# Patient Record
Sex: Male | Born: 1960
Health system: Southern US, Community
[De-identification: ages and names within clinical notes are randomized; demographics above are authoritative.]

## PROBLEM LIST (undated history)

## (undated) DIAGNOSIS — E041 Nontoxic single thyroid nodule: Secondary | ICD-10-CM

## (undated) DIAGNOSIS — E785 Hyperlipidemia, unspecified: Secondary | ICD-10-CM

## (undated) DIAGNOSIS — N200 Calculus of kidney: Secondary | ICD-10-CM

## (undated) DIAGNOSIS — I639 Cerebral infarction, unspecified: Secondary | ICD-10-CM

## (undated) HISTORY — DX: Hyperlipidemia, unspecified: E78.5

## (undated) HISTORY — DX: Calculus of kidney: N20.0

## (undated) HISTORY — DX: Nontoxic single thyroid nodule: E04.1

## (undated) HISTORY — DX: Cerebral infarction, unspecified: I63.9

---

## 2001-01-06 HISTORY — PX: APPENDECTOMY: SHX54

## 2004-08-10 ENCOUNTER — Emergency Department: Payer: Self-pay | Admitting: Emergency Medicine

## 2004-08-15 ENCOUNTER — Emergency Department: Payer: Self-pay | Admitting: Unknown Physician Specialty

## 2005-03-30 ENCOUNTER — Emergency Department: Payer: Self-pay | Admitting: Emergency Medicine

## 2005-04-24 ENCOUNTER — Ambulatory Visit: Payer: Self-pay | Admitting: Urology

## 2008-04-28 ENCOUNTER — Ambulatory Visit: Payer: Self-pay | Admitting: Family Medicine

## 2011-01-07 DIAGNOSIS — E041 Nontoxic single thyroid nodule: Secondary | ICD-10-CM

## 2011-01-07 DIAGNOSIS — I639 Cerebral infarction, unspecified: Secondary | ICD-10-CM

## 2011-01-07 HISTORY — DX: Nontoxic single thyroid nodule: E04.1

## 2011-01-07 HISTORY — DX: Cerebral infarction, unspecified: I63.9

## 2011-10-02 LAB — BASIC METABOLIC PANEL
Calcium, Total: 9.1 mg/dL (ref 8.5–10.1)
Chloride: 106 mmol/L (ref 98–107)
Co2: 26 mmol/L (ref 21–32)
Creatinine: 1.02 mg/dL (ref 0.60–1.30)
EGFR (African American): >60
Glucose: 177 mg/dL — ABNORMAL HIGH (ref 65–99)
Potassium: 3.8 mmol/L (ref 3.5–5.1)
Sodium: 141 mmol/L (ref 136–145)

## 2011-10-02 LAB — CBC
Platelet: 211 10*3/uL (ref 150–440)
RDW: 13 % (ref 11.5–14.5)
WBC: 9.5 10*3/uL (ref 3.8–10.6)

## 2011-10-02 LAB — TROPONIN I: Troponin-I: <0.02 ng/mL

## 2011-10-03 ENCOUNTER — Inpatient Hospital Stay: Payer: Self-pay | Admitting: Student

## 2011-10-03 LAB — URINALYSIS, COMPLETE
Bacteria: NONE SEEN
Blood: NEGATIVE
Glucose,UR: 50 mg/dL (ref 0–75)
Ketone: NEGATIVE
Specific Gravity: 1.016 (ref 1.003–1.030)
Squamous Epithelial: NONE SEEN
WBC UR: NONE SEEN /HPF (ref 0–5)

## 2011-10-04 LAB — LIPID PANEL
Ldl Cholesterol, Calc: 41 mg/dL (ref 0–100)
Triglycerides: 152 mg/dL (ref 0–200)
VLDL Cholesterol, Calc: 30 mg/dL (ref 5–40)

## 2011-10-04 LAB — T4, FREE: Free Thyroxine: 1.42 ng/dL (ref 0.76–1.46)

## 2011-10-14 ENCOUNTER — Ambulatory Visit: Payer: Self-pay | Admitting: Family Medicine

## 2011-10-19 LAB — CBC
HGB: 13.4 g/dL (ref 13.0–18.0)
MCH: 29.5 pg (ref 26.0–34.0)
MCV: 90 fL (ref 80–100)
Platelet: 251 10*3/uL (ref 150–440)
RBC: 4.55 10*6/uL (ref 4.40–5.90)
RDW: 12.8 % (ref 11.5–14.5)
WBC: 13.4 10*3/uL — ABNORMAL HIGH (ref 3.8–10.6)

## 2011-10-19 LAB — COMPREHENSIVE METABOLIC PANEL
Albumin: 4.1 g/dL (ref 3.4–5.0)
Alkaline Phosphatase: 48 U/L — ABNORMAL LOW (ref 50–136)
Anion Gap: 11 (ref 7–16)
Bilirubin,Total: 0.6 mg/dL (ref 0.2–1.0)
Calcium, Total: 9 mg/dL (ref 8.5–10.1)
Chloride: 107 mmol/L (ref 98–107)
Creatinine: 1.96 mg/dL — ABNORMAL HIGH (ref 0.60–1.30)
EGFR (African American): 44 — ABNORMAL LOW
Osmolality: 295 (ref 275–301)
Potassium: 4 mmol/L (ref 3.5–5.1)
Sodium: 143 mmol/L (ref 136–145)

## 2011-10-19 LAB — URINALYSIS, COMPLETE
Bilirubin,UR: NEGATIVE
Glucose,UR: NEGATIVE mg/dL (ref 0–75)
Leukocyte Esterase: NEGATIVE
Nitrite: NEGATIVE
Specific Gravity: 1 (ref 1.003–1.030)
WBC UR: 2 /HPF (ref 0–5)

## 2011-10-20 ENCOUNTER — Observation Stay: Payer: Self-pay | Admitting: Internal Medicine

## 2011-10-20 LAB — BASIC METABOLIC PANEL
Anion Gap: 8 (ref 7–16)
BUN: 24 mg/dL — ABNORMAL HIGH (ref 7–18)
Calcium, Total: 8.3 mg/dL — ABNORMAL LOW (ref 8.5–10.1)
Chloride: 111 mmol/L — ABNORMAL HIGH (ref 98–107)
Co2: 26 mmol/L (ref 21–32)
EGFR (African American): 53 — ABNORMAL LOW
Osmolality: 296 (ref 275–301)
Potassium: 3.9 mmol/L (ref 3.5–5.1)

## 2011-10-21 LAB — URINE CULTURE

## 2012-03-06 ENCOUNTER — Encounter: Payer: Self-pay | Admitting: *Deleted

## 2012-03-06 DIAGNOSIS — N183 Chronic kidney disease, stage 3 unspecified: Secondary | ICD-10-CM | POA: Insufficient documentation

## 2012-03-06 DIAGNOSIS — I10 Essential (primary) hypertension: Secondary | ICD-10-CM | POA: Insufficient documentation

## 2012-06-03 ENCOUNTER — Ambulatory Visit: Payer: Self-pay | Admitting: General Surgery

## 2012-07-13 ENCOUNTER — Ambulatory Visit (INDEPENDENT_AMBULATORY_CARE_PROVIDER_SITE_OTHER): Payer: BC Managed Care – PPO | Admitting: General Surgery

## 2012-07-13 ENCOUNTER — Other Ambulatory Visit: Payer: Self-pay

## 2012-07-13 ENCOUNTER — Encounter: Payer: Self-pay | Admitting: General Surgery

## 2012-07-13 VITALS — BP 128/72 | HR 74 | Resp 12 | Ht 68.0 in | Wt 127.0 lb

## 2012-07-13 DIAGNOSIS — E041 Nontoxic single thyroid nodule: Secondary | ICD-10-CM

## 2012-07-13 NOTE — Patient Instructions (Addendum)
Patient to return in 6 months.

## 2012-07-13 NOTE — Progress Notes (Signed)
Patient ID: [name], male   DOB: 01/21/1960, 52 y.o.   MRN: [id number]  Chief Complaint  Patient presents with  . Other    thyroid    HPI [name] is a 52 y.o. male.  Patient here today for 6 month follow up thyroid lesion. No complaints at this time.  The patient is accompanied by his daughter who acted as Nurse, learning disability. She reports that her father is having no difficulty with eating, his weight has stabilized since his last visit, and they both attributed his weight loss (18 pounds over 6 months) to discontinuing the redmeat and other dietary modifications. He reports no difficulty with eating, no dysphasia or any heat intolerance. The latter is particularly notable as he works in [employer] of the family [employer]. HPI  Past Medical History  Diagnosis Date  . Hypertension 2010  . Diabetes mellitus without complication 2008  . Stroke 2013  . Cyst of thyroid 2013    right  . Kidney stones   . Hyperlipidemia     Past Surgical History  Procedure Laterality Date  . Appendectomy  2003    History reviewed. No pertinent family history.  Social History History  Substance Use Topics  . Smoking status: Never Smoker   . Smokeless tobacco: Never Used  . Alcohol Use: No    Allergies  Allergen Reactions  . Toradol (Ketorolac Tromethamine) Rash    Current Outpatient Prescriptions  Medication Sig Dispense Refill  . carvedilol (COREG) 6.25 MG tablet Take 6.25 mg by mouth 2 (two) times daily with a meal.      . dipyridamole-aspirin (AGGRENOX) 200-25 MG per 12 hr capsule Take 1 capsule by mouth 2 (two) times daily.      Marland Kitchen losartan (COZAAR) 50 MG tablet Take 50 mg by mouth daily.      . metFORMIN (GLUCOPHAGE) 1000 MG tablet Take 1,000 mg by mouth 2 (two) times daily with a meal.      . pravastatin (PRAVACHOL) 20 MG tablet Take 20 mg by mouth daily.       No current facility-administered medications for this visit.    Review of Systems Review of Systems  Constitutional: Negative.    Respiratory: Negative.   Cardiovascular: Negative.     Blood pressure 128/72, pulse 74, resp. rate 12, height 5\' 8"  (1.727 m), weight 127 lb (57.607 kg).  Physical Exam Physical Exam  Constitutional: He is oriented to person, place, and time. He appears well-developed and well-nourished.  Neck: No thyromegaly present.  Cardiovascular: Normal rate, regular rhythm and normal heart sounds.   No murmur heard. Pulmonary/Chest: Effort normal and breath sounds normal.  Lymphadenopathy:    He has no cervical adenopathy.  Neurological: He is alert and oriented to person, place, and time.  Skin: Skin is warm and dry.    Data Reviewed Ultrasound examination of the thyroid was completed. The right lobe measures 1.2 x 1.8 0.3 0.7 cm in maximal dimension. They isolated solid nodule measuring 1.0 x 1.1 x 1.17 cm is appreciated in the mid to upper pole. The left lobe of the thyroid measures 1.4 x 2.1 x 3.3 cm. No lesions are identified in the slope of the thyroid.  Previous measurements of the dominant nodule in the right lobe was at 1.1 cm. There is a minimal 0.7 change over the last 6 months. This is within measurement error. The secondary nodules noted on his last exam are no longer appreciated.  Assessment    Stable right lobe thyroid  nodule, asymptomatic.     Plan    A followup examination with repeat thyroid ultrasound in 6 months as planned.        Earline Mayotte 07/14/2012, 8:00 PM

## 2012-07-14 ENCOUNTER — Encounter: Payer: Self-pay | Admitting: General Surgery

## 2012-07-30 ENCOUNTER — Encounter: Payer: Self-pay | Admitting: General Surgery

## 2013-01-26 ENCOUNTER — Ambulatory Visit: Payer: BC Managed Care – PPO | Admitting: General Surgery

## 2013-01-27 ENCOUNTER — Encounter: Payer: Self-pay | Admitting: *Deleted

## 2013-03-14 IMAGING — CT CT STONE STUDY
1 of 2 series · 15 of 32 positions shown, 19 images · non-contrast
Comparison: None

REASON FOR EXAM: left flank pain,  hematuria, acute renal failure
COMMENTS:   May transport without cardiac monitor

PROCEDURE:     CT  - CT ABDOMEN /PELVIS WO (STONE)  - October 19, 2011 [DATE]
RESULT:     Indication: Flank Pain
TECHNIQUE: Multiple axial images from the lung bases to the symphysis pubis
were obtained without oral and without intravenous contrast.

[Series 2: 3mm soft tissue · axial · 0.67mm/px · z∈[-643,-193]mm · 15 of 164 slices shown, 19 images]
[im 7/164  soft-tissue]
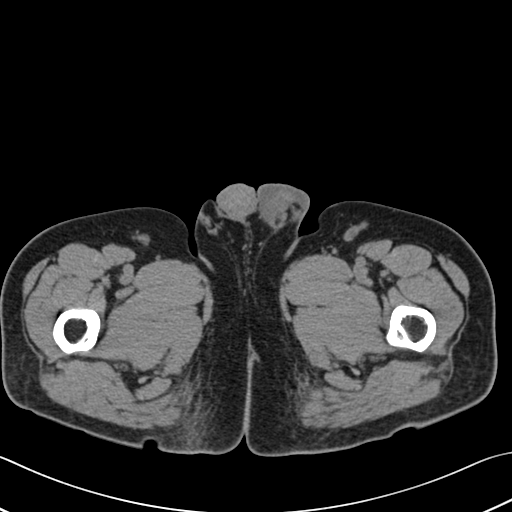
[im 7/164  bone]
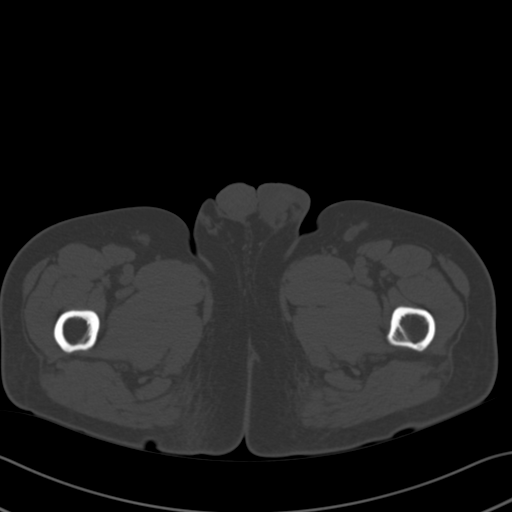
[im 19/164  soft-tissue]
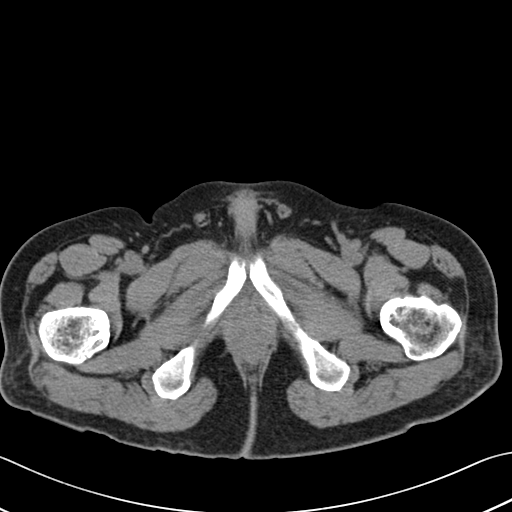
[im 32/164  soft-tissue]
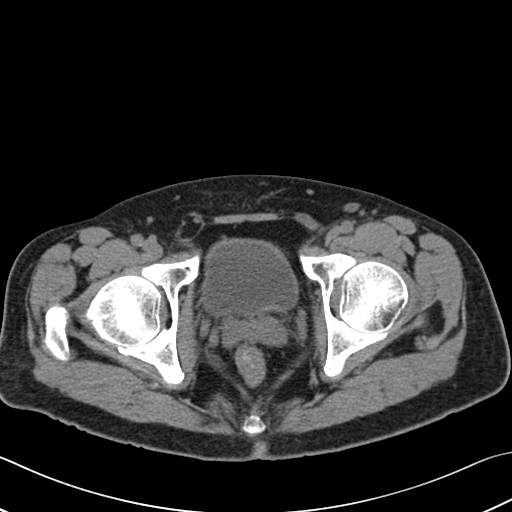
[im 44/164  soft-tissue]
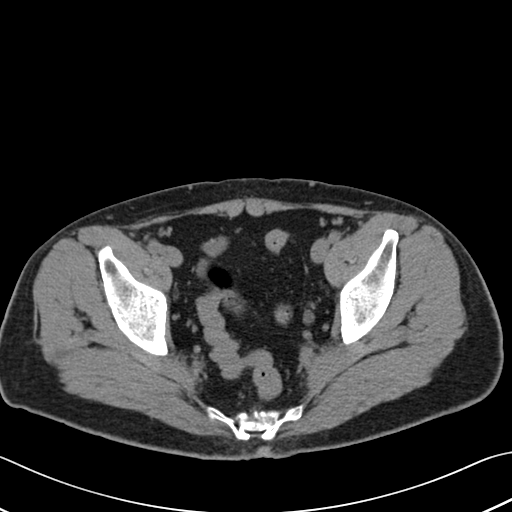
[im 57/164  soft-tissue]
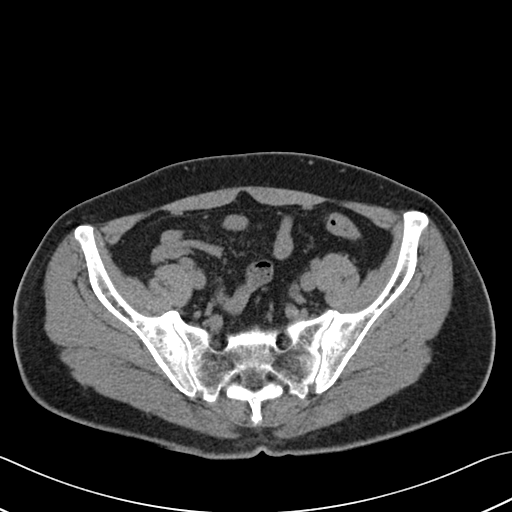
[im 69/164  soft-tissue]
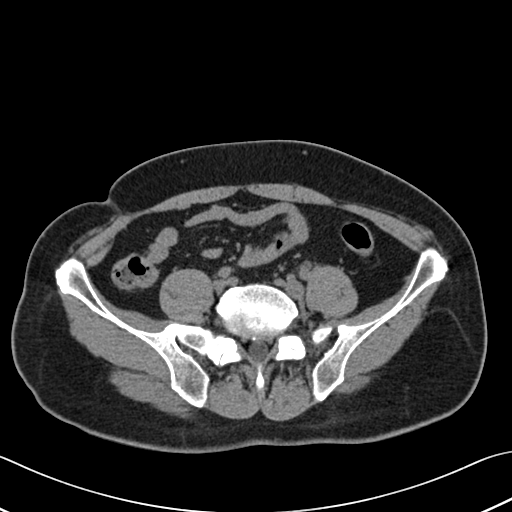
[im 82/164  soft-tissue]
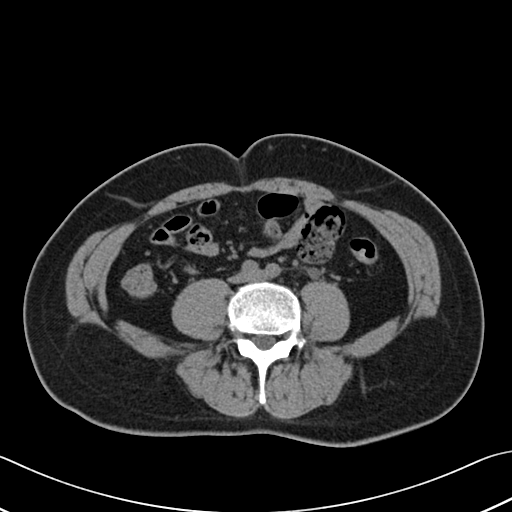
[im 95/164  soft-tissue]
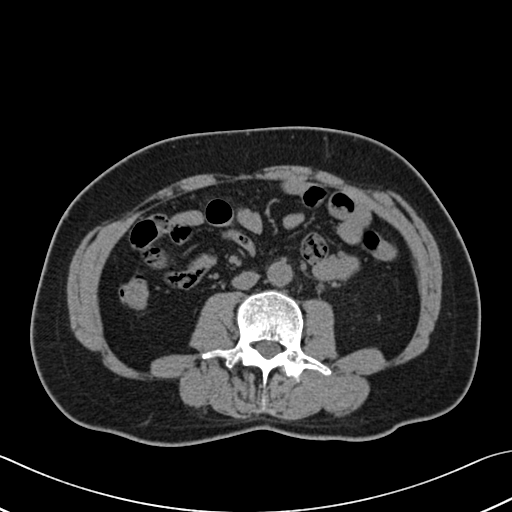
[im 107/164  soft-tissue]
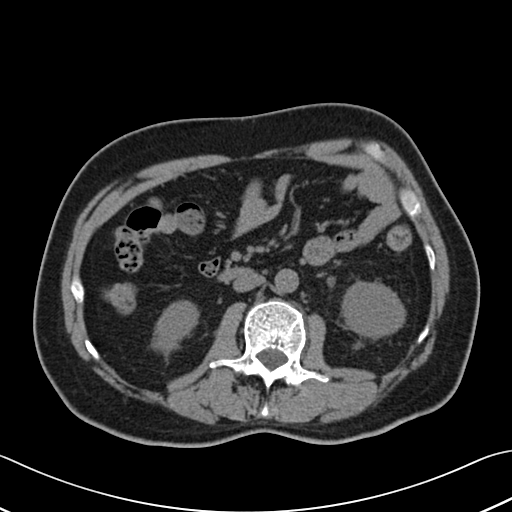
[im 107/164  bone]
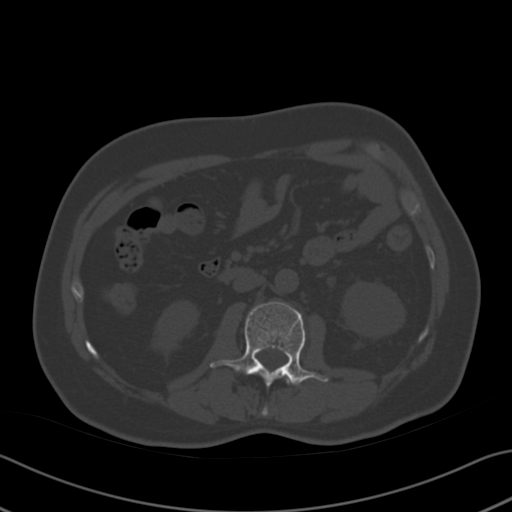
[im 120/164  soft-tissue]
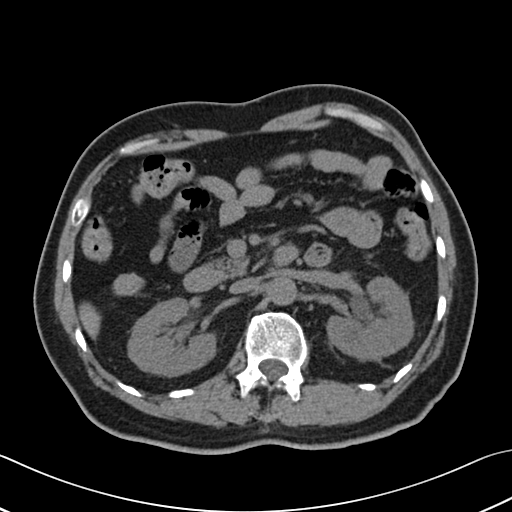
[im 132/164  soft-tissue]
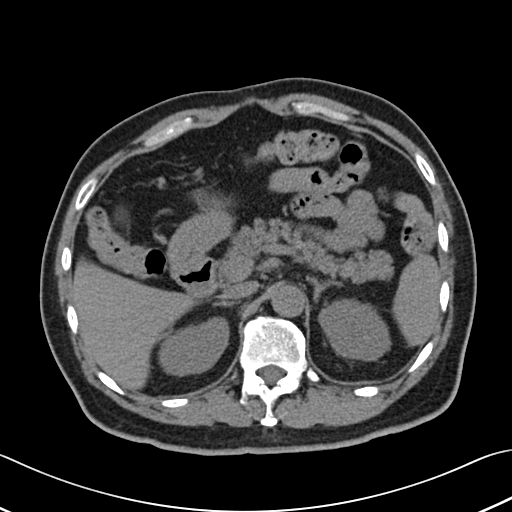
[im 138/164  lung]
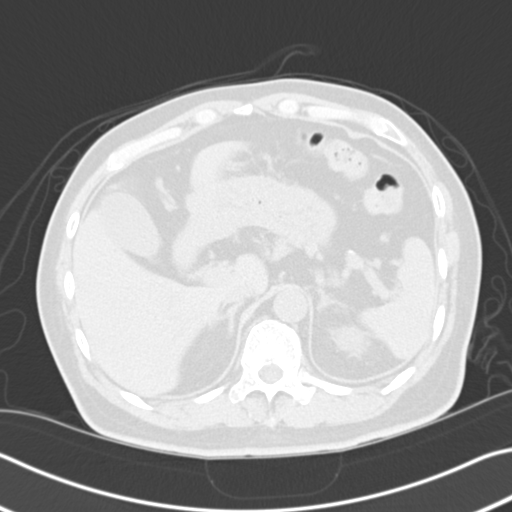
[im 145/164  soft-tissue]
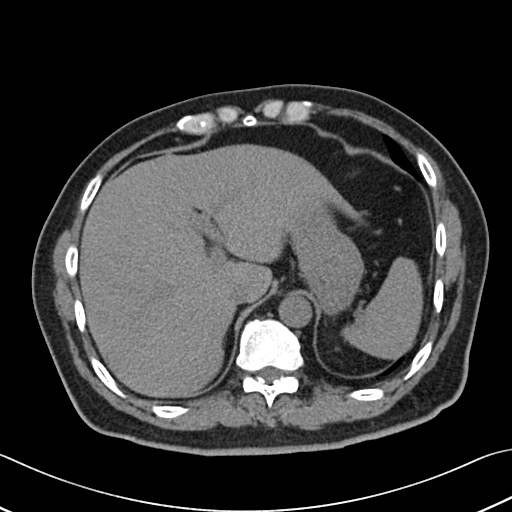
[im 145/164  lung]
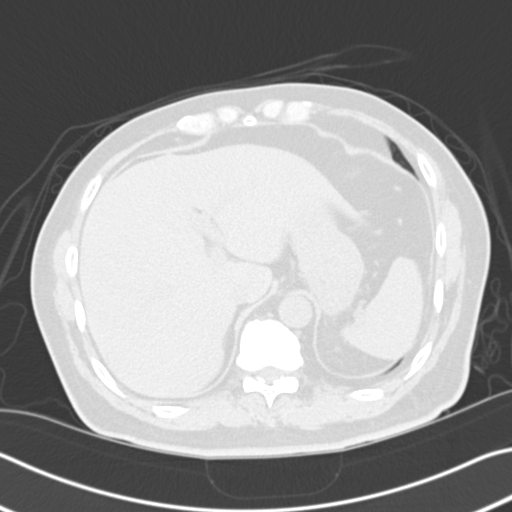
[im 151/164  lung]
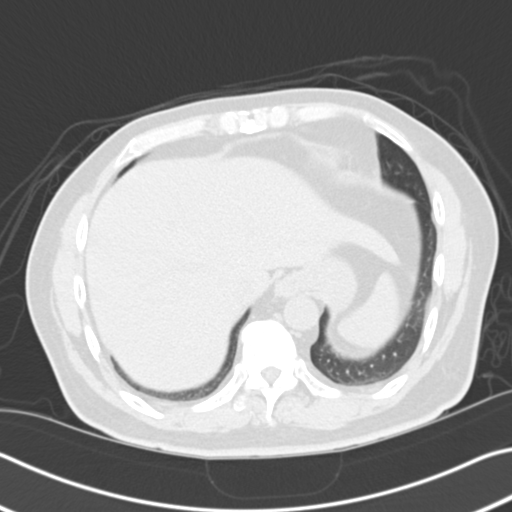
[im 157/164  soft-tissue]
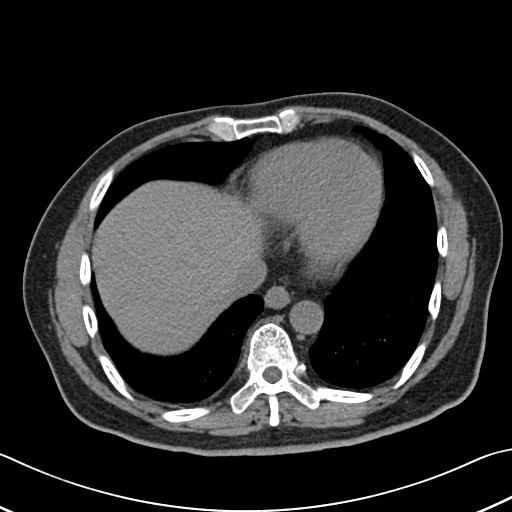
[im 157/164  lung]
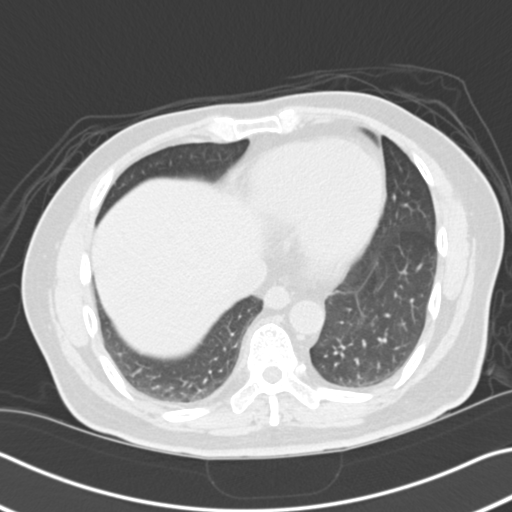

[15 of 32 positions shown; findings below may reference images not displayed]

FINDINGS: The lung bases are clear. There is no pleural or pericardial effusions.

There is a 1 mm left UVJ calculus resulting in mild left
hydroureteronephrosis. There is no other renal, ureteral or bladder
calculus. There is a 12 mm hypodense left interpolar renal mass measuring
fluid attenuation with high attenuation layering dependently likely
representing milk of calcium within a cyst. The bladder is unremarkable.

The liver demonstrates no focal abnormality. The gallbladder is
unremarkable. The spleen demonstrates no focal abnormality. The adrenal
glands and pancreas are normal.

The unopacified stomach, duodenum, small intestine, and large intestine are
unremarkable, but evaluation is limited by lack of oral contrast.  There is
no pneumoperitoneum, pneumatosis, or portal venous gas. There is no
abdominal or pelvic free fluid. There is no lymphadenopathy.

The abdominal aorta is normal in caliber .

The osseous structures are unremarkable.
IMPRESSION: 1. There is a 1 mm left UVJ calculus resulting in mild left
hydroureteronephrosis.

[REDACTED]

## 2013-03-22 ENCOUNTER — Ambulatory Visit: Payer: BC Managed Care – PPO

## 2013-03-22 ENCOUNTER — Ambulatory Visit (INDEPENDENT_AMBULATORY_CARE_PROVIDER_SITE_OTHER): Payer: BC Managed Care – PPO | Admitting: General Surgery

## 2013-03-22 ENCOUNTER — Encounter: Payer: Self-pay | Admitting: General Surgery

## 2013-03-22 VITALS — BP 130/74 | HR 78 | Resp 14 | Ht 68.0 in | Wt 142.0 lb

## 2013-03-22 DIAGNOSIS — E041 Nontoxic single thyroid nodule: Secondary | ICD-10-CM

## 2013-03-22 NOTE — Progress Notes (Signed)
Patient ID: [name], male   DOB: 25-Aug-1960, 53 y.o.   MRN: [id number]  Chief Complaint  Patient presents with  . Follow-up    thyroid     HPI [name] is a 53 y.o. male.  Here today for follow up thyroid ultrasound. No new problems at this time. The patient is accompanied by his daughter, [name], R.N. She is a trauma ICU nurse at [employer] in [place]. She acted as Optometrist for a father who is a native of [place].  The patient is presently asymptomatic in regards to his thyroid. No change in diet since his last visit. In July of 2014 modest weight loss was documented, and this was attributed to a significant dictation of his dietary habits in response to the diagnosis of diabetes.  The patient's weight is up 17 pounds from his July 2014 exam.  HPI  Past Medical History  Diagnosis Date  . Hypertension 2010  . Diabetes mellitus without complication 2595  . Stroke 2013  . Cyst of thyroid 2013    right  . Kidney stones   . Hyperlipidemia     Past Surgical History  Procedure Laterality Date  . Appendectomy  2003    No family history on file.  Social History History  Substance Use Topics  . Smoking status: Never Smoker   . Smokeless tobacco: Never Used  . Alcohol Use: No    Allergies  Allergen Reactions  . Toradol [Ketorolac Tromethamine] Rash    Current Outpatient Prescriptions  Medication Sig Dispense Refill  . carvedilol (COREG) 6.25 MG tablet Take 6.25 mg by mouth 2 (two) times daily with a meal.      . dipyridamole-aspirin (AGGRENOX) 200-25 MG per 12 hr capsule Take 1 capsule by mouth 2 (two) times daily.      Marland Kitchen lisinopril (PRINIVIL,ZESTRIL) 5 MG tablet daily.      . pravastatin (PRAVACHOL) 20 MG tablet Take 20 mg by mouth daily.       No current facility-administered medications for this visit.    Review of Systems Review of Systems  Constitutional: Negative.   Respiratory: Negative.   Cardiovascular: Negative.     Blood  pressure 130/74, pulse 78, resp. rate 14, height 5\' 8"  (1.727 m), weight 142 lb (64.411 kg).  Physical Exam Physical Exam  Constitutional: He appears well-developed and well-nourished.  HENT:  Head: Normocephalic.  Eyes: Pupils are equal, round, and reactive to light.  Neck: Normal range of motion. Neck supple. No tracheal deviation present. No mass and no thyromegaly present.  Cardiovascular: Normal rate and regular rhythm.   Pulmonary/Chest: No stridor.  Lymphadenopathy:    He has no cervical adenopathy.    Data Reviewed  Ultrasound examination of the thyroid was completed to reassess the previously identified solid nodule of the right lobe. The right lobe of the thyroid measures 1.4 x 1.6 x 3.5 cm. The solitary heterogeneous nodule in the mid to upper pole measures 1.1 x 1.4 x 1.65 cm. Minimal vascularity is noted on duplex imaging. A small 0.2 cm cyst is noted near the isthmus on the right. The left lobe of the thyroid measures 0.9 x 1.5 x 3.0 cm.  July 2014:  The right lobe measures 1.2 x 1.8x 3. 7 cm in maximal dimension. They isolated solid nodule measuring 1.0 x 1.1 x 1.17 cm is appreciated in the mid to upper pole. The left lobe of the thyroid measures 1.4 x 2.1 x 3.3 cm. No  lesions are identified in the slope of the thyroid.  There is 5 mm increase in size of the lesion in the last 9 months.   Assessment    Solitary right thyroid nodule with mild interval change.     Plan    Initial plans with the patient and his daughter were present were for one year followup. After review of the July 2014 images as well as today's study, will ask for a return visit in 6 months. He may benefit from FNA sampling at that time, and he will be asked to discontinue Aggrenox for one week prior to the procedure, in its place make use of aspirin 81 mg daily.        [name] 03/23/2013, 6:03 AM

## 2013-03-22 NOTE — Patient Instructions (Signed)
Patient to return in 1 year. The patient is aware to call back for any questions or concerns.  

## 2013-03-23 ENCOUNTER — Telehealth: Payer: Self-pay | Admitting: *Deleted

## 2013-03-23 NOTE — Telephone Encounter (Signed)
Message copied by Carson Myrtle on Wed Mar 23, 2013  9:12 AM ------      Message from: Truxton, Bertram W      Created: Wed Mar 23, 2013  6:12 AM       Please contact the patient's daughter who is a Marine scientist at [employer] and tell her that on review review of her father's previous ultrasounds there has been a 5 mm change in the nodule. It is still well below the worrisome signs for malignancy, but I would like to see him in 6 months rather than one year if this would fit in with her schedule. He needs to be off his Aggrenox for a week prior to the procedure, substituting a 81 mg aspirin tablet in its place in the event that we need to complete a FNA sample. Thank you ------

## 2013-03-23 NOTE — Telephone Encounter (Signed)
Notified patient daughter [name]) as instructed. Discussed follow-up appointments for 6 months, patient daughter agrees. Aware if things change before then to call and come in sooner. Recall changed to reflect instructions.

## 2013-09-28 ENCOUNTER — Ambulatory Visit: Payer: BC Managed Care – PPO

## 2013-09-28 ENCOUNTER — Ambulatory Visit (INDEPENDENT_AMBULATORY_CARE_PROVIDER_SITE_OTHER): Payer: BC Managed Care – PPO | Admitting: General Surgery

## 2013-09-28 ENCOUNTER — Encounter: Payer: Self-pay | Admitting: General Surgery

## 2013-09-28 VITALS — BP 132/86 | HR 62 | Resp 12 | Ht 68.0 in | Wt 142.0 lb

## 2013-09-28 DIAGNOSIS — E041 Nontoxic single thyroid nodule: Secondary | ICD-10-CM

## 2013-09-28 HISTORY — PX: FINE NEEDLE ASPIRATION: SHX406

## 2013-09-28 NOTE — Progress Notes (Signed)
Patient ID: [name], male   DOB: April 15, 1960, 53 y.o.   MRN: [id number]  Chief Complaint  Patient presents with  . Follow-up    thyroid ultrasound    HPI Coron [name] is a 53 y.o. male here today for a follow up thyroid ultrasound. Denies any pain. Denies any trouble swallowing. Here today with his daughter [name] and [place] interpreter [name].  HPI  Past Medical History  Diagnosis Date  . Hypertension 2010  . Diabetes mellitus without complication 7619  . Stroke 2013  . Cyst of thyroid 2013    right  . Kidney stones   . Hyperlipidemia     Past Surgical History  Procedure Laterality Date  . Appendectomy  2003    No family history on file.  Social History History  Substance Use Topics  . Smoking status: Never Smoker   . Smokeless tobacco: Never Used  . Alcohol Use: No    Allergies  Allergen Reactions  . Toradol [Ketorolac Tromethamine] Rash    Current Outpatient Prescriptions  Medication Sig Dispense Refill  . aspirin 81 MG tablet Take 81 mg by mouth daily.      . carvedilol (COREG) 6.25 MG tablet Take 6.25 mg by mouth 2 (two) times daily with a meal.      . lisinopril (PRINIVIL,ZESTRIL) 5 MG tablet daily.      . pravastatin (PRAVACHOL) 20 MG tablet Take 20 mg by mouth daily.       No current facility-administered medications for this visit.    Review of Systems Review of Systems  Constitutional: Negative.   Respiratory: Negative.   Cardiovascular: Negative.     Blood pressure 132/86, pulse 62, resp. rate 12, height 5\' 8"  (1.727 m), weight 142 lb (64.411 kg).  Physical Exam Physical Exam  Constitutional: He is oriented to person, place, and time. He appears well-developed and well-nourished.  Neck: Neck supple. No thyromegaly present.  Cardiovascular: Normal rate, regular rhythm and normal heart sounds.   No murmur heard. Pulmonary/Chest: Effort normal and breath sounds normal.  Lymphadenopathy:    He has no cervical adenopathy.   Neurological: He is alert and oriented to person, place, and time.  Skin: Skin is warm and dry.    Data Reviewed Carotid ultrasound dated October 03, 2011 noted a complex mass in the right lobe of the thyroid measuring 1.0 x 1.1 x 1.2 cm. No hemodynamically significant carotid stenosis was appreciated.  Thyroid ultrasound dated October 14, 2011 showed multiple thyroid nodules, the largest of the right lobe measuring 1.1 cm with a small hypoechoic nodule in the isthmus measuring 0.3 cm and a small 0.6 cm nodule in the left lobe of the thyroid. FNA was not completed at the time of original diagnosis is the patient recently suffered a CVA and was on Aggrenox.   07/13/2012 exam:The isolated solid nodule measuring 1.0 x 1.1 x 1.17 cm is appreciated in the mid to upper pole.   03/22/2013 exam:  The solitary heterogeneous nodule in the mid to upper pole measures 1.1 x 1.4 x 1.65 cm. Minimal vascularity is noted on duplex imaging.   Examination of the thyroid today shows the previously identified solitary nodule in the midportion of the right lobe measuring 1.1 x 1.12 x 1.3 cm. Overall dimensions of the right lobe was 1.62 x 1.65 x 4.14 cm. The left lobe measured 1.3 x 2.3 x 3.5 cm.  The patient had been asked to discontinue his Aggrenox in the event that FNA  was appropriate. All the area stable over the last 6 months it had shown modest increase in the past and it was elected to complete and FNA sample today. 1 cc of 1% plain Xylocaine was utilized well tolerated. Multiple passes through the lesion were completed and 4 slides prepared for cytology. No bleeding was noted. The patient may resume his Aggrenox tomorrow.    Assessment    Solitary nodule the right lobe of the thyroid. No clinical symptoms.    Plan    The patient's daughter, [name] will be contacted when cytology results are available and she will relays these to her father  If the cytology is benign, a followup exam in one year will  be appropriate    PCP: [name]., [name] 09/29/2013, 3:44 PM

## 2013-09-28 NOTE — Patient Instructions (Signed)
The patient is aware to call back for any questions or concerns.  

## 2013-10-11 LAB — FINE-NEEDLE ASPIRATION

## 2013-10-12 ENCOUNTER — Telehealth: Payer: Self-pay

## 2013-10-12 NOTE — Telephone Encounter (Signed)
Notified patient as instructed, patient pleased. Discussed follow-up in September, patient agrees

## 2013-10-12 NOTE — Telephone Encounter (Signed)
Message copied by Lesly Rubenstein on Wed Oct 12, 2013  8:32 AM ------      Message from: [place], [place] W      Created: Wed Oct 12, 2013  8:17 AM       Please contact patient's daughter [name] (she is a Therapist, sports at [employer]) to let her know FNA showed a simple colloid nodule. No evidence of cancer.  She will pass the word on to her father.      Apologize for the delay. (Took 2 weeks)      Contact [facility] to find out why it took two weeks. Thanks.      ----- Message -----         From: [facility] Lab Results In Interface         Sent: 10/11/2013   9:39 AM           To: Robert Bellow, MD                   ------

## 2014-04-25 NOTE — H&P (Signed)
PATIENT NAMEKUNAAL, WALKINS MR#:  [id number] DATE OF BIRTH:  17-Sep-1960  DATE OF ADMISSION:  10/20/2011  PRIMARY CARE PHYSICIAN: Dr. Luan Pulling at [place] [place]: Nausea and vomiting and left flank pain.   HISTORY OF PRESENT ILLNESS: This is a 54 year old Asian gentleman who does not speak [place]. His daughter is here and speaks fluent [place] and is actually a Marine scientist. He was admitted less than a month ago with a right basal ganglial stroke. He has been on Aggrenox ever since as well as other medications. He does have a history of kidney stones. He started having left flank pain earlier today. He has been nauseated on and off ever since discharge. He noticed blood in his urine, it was tea-colored and then he also noticed some sediment in it later in the day. His pain is actually getting better but it has moved down to his groin area. We do have a CT scan pending. He does have some acute renal failure. His daughter also reports that since being out of the hospital he has been recording some low blood pressures, sometimes systolics in the 41Y and 60Y and he had additional blood pressure medications and additional dosing added here in the hospital on his previous admission. He has had some symptoms of some dizziness from it. With this and the dehydration will go ahead and admit him for observation to further evaluate and adjust medications.   PAST MEDICAL HISTORY:  1. Recent right basal ganglial stroke with residual left facial droop.  2. Hypertension.  3. Type 2 diabetes.   ALLERGIES: No known drug allergies.   CURRENT MEDICATIONS:  1. Pravastatin 20 mg daily.  2. Metformin 1000 mg daily.  3. Losartan 100 mg daily.  4. Aggrenox 200 mg b.i.d.  5. Carvedilol 6.25 mg b.i.d.   SOCIAL HISTORY: Does not smoke. Does not drink alcohol.   FAMILY HISTORY: Significant for Alzheimer's dementia.    REVIEW OF SYSTEMS: CONSTITUTIONAL: No fever or chills. EYES: No blurred  vision. ENT: No hearing loss. CARDIOVASCULAR: No chest pain. PULMONARY: No shortness of breath. GASTROINTESTINAL: He has had nausea and vomiting. GENITOURINARY: He has had some left flank pain and groin pain. ENDOCRINE: No heat or cold intolerance. INTEGUMENT: No rash. MUSCULOSKELETAL: Occasional joint pain. NEUROLOGIC: He has a left facial droop.   PHYSICAL EXAMINATION:  VITAL SIGNS: Temperature 97.8, pulse 70, respirations 16, blood pressure 117/76.   GENERAL: This is a well-nourished Asian gentleman in no acute distress.   HEENT: The pupils are equal, round, reactive to light. Sclerae is not icteric. The oral mucosa is moist. Oropharynx is clear. Nasopharynx is clear.   NECK: Supple. No JVD, lymphadenopathy. No thyromegaly.   CARDIOVASCULAR: Regular rate and rhythm. No murmurs, rubs, or gallops.   LUNGS: Clear to auscultation. No dullness to percussion. He is not using accessory muscles.   ABDOMEN: Soft, nondistended. Bowel sounds are positive. There is some mild left flank tenderness and left groin tenderness. No rebound or guarding.   EXTREMITIES: There is no edema. No joint deformity.   NEUROLOGIC: He has a mild left facial droop, otherwise cranial nerves II through XII are intact. He is alert and oriented x4.   SKIN: Moist with no rash.   LABORATORY, DIAGNOSTIC AND RADIOLOGICAL DATA: BUN 29, creatinine 1.96, white blood cells 13.4, hemoglobin 13.4. Urinalysis shows large amount of RBCs and blood, very little bacteria.    ASSESSMENT AND PLAN:  1. Acute renal failure likely secondary to  nausea and vomiting. At this point looks prerenal, do not suspect obstruction but we do have a CT scan pending. Will give him IV fluids pretty aggressively and monitor.  2. Probable renal stone. CT scan is pending. He does have a history of it and with his symptoms coming in suspect that he either passed a stone or has one currently in his ureter. Will hydrate him, give him pain medications as needed.  Follow up with a CT to make sure this is not obstructive, if it is will consult urology.  3. Hypertension. His daughter who is a nurse reported that they have been recording low blood pressure since his discharge from the hospital. With a history of a recent stroke I don't want him hypotensive. I am going to go ahead and cut his dose of losartan down to 50 mg a day. Monitor him closely. Make sure we get follow up with his primary care doctor on discharge to adjust these medications any further.  4. History of cerebrovascular accident. This was about three weeks ago. He is on Aggrenox. He does have some blood in his urine but at this point I see no reason to discontinue the Aggrenox unless he has further bleeding.  5. Diabetes. Will continue his current medications and add sliding scale as needed.   TIME SPENT ON ADMISSION: 45 minutes.   ____________________________ Baxter Hire, MD jdj:cms D: 10/20/2011 00:16:43 ET T: 10/20/2011 08:25:10 ET JOB#: [id number]  cc: Baxter Hire, MD, <Dictator> [facility], Dr. Valera Castle MD ELECTRONICALLY SIGNED 10/20/2011 16:18

## 2014-04-25 NOTE — Discharge Summary (Signed)
PATIENT NAMERAYMONE, Hoffman MR#:  [id number] DATE OF BIRTH:  1960/11/04  DATE OF ADMISSION:  10/03/2011 DATE OF DISCHARGE:  10/05/2011  CHIEF COMPLAINT: Left facial weakness and mild slurred speech.   DISCHARGE DIAGNOSES:  1. Acute right basal ganglia stroke.  2. Uncontrolled hypertension in the setting of medication noncompliance.  3. Diabetes.   SECONDARY DIAGNOSIS: Hypercholesterolemia.   DISCHARGE MEDICATIONS:  1. Pravastatin 20 mg daily.  2. Metformin 1000 mg daily.  3. Losartan 100 mg daily.  4. Aggrenox 1 tab 2 times a day.  5. Carvedilol 6.25 mg 2 times a day.   DIET: Low sodium consistent carb ADA diet.   ACTIVITY: As tolerated.   FOLLOW-UP: Please follow-up with your PCP within 1 to 2 weeks.   DISPOSITION: Home.   CODE STATUS: FULL CODE.   HISTORY OF PRESENT ILLNESS: For full details of history and physical, please see the dictation on 10/03/2011 by Dr. Lenore Manner. Briefly, this is a 54 year old [place] male with hypertension, diabetes, and hypercholesterolemia who presented with left facial weakness and slurred speech and was admitted to the hospitalist service after he was found to have left facial droop and sequela of acute stroke.   SIGNIFICANT LABS AND IMAGING: Hemoglobin A1c 6.8. Initial hemoglobin 14.7, creatinine 1.02, glucose 177, LDL 41, triglycerides 152, cholesterol 114. Troponin negative. Free thyroxine 1.42. TSH 2.5. WBC 9.5, platelets 211. Urinalysis not suggestive of infection.   Echocardiogram showing EF of greater than 55%. No thrombus. LV wall motion is normal.   CT of the head without contrast showing hypodensities in the basal ganglia bilaterally and in the right gray-white junction, sequela of previous lacunar infarct, age is not clear. MRI is recommended.  MRI of the brain without contrast on 10/03/2011 showing acute right basal ganglia nonhemorrhagic infarct.   Ultrasound of the carotids no evidence of hemodynamic significant carotid stenosis.  There is a complex nodule in the right thyroid lobe measuring 1.2 cm.   HOSPITAL COURSE: The patient was admitted to the hospitalist service on telemetry and started on aspirin and his statin. The patient did have some left right-sided weakness prior with left facial droop and tongue deviation as well towards the left. MRI was ordered given the CT findings and the patient's symptoms which did confirm this stroke. His aspirin was changed to Aggrenox and echocardiogram and carotid ultrasounds were ordered, the results of which are dictated. He did not have any significant arrhythmias on the tele. His LDL is within goal. He was seen by the physical therapist and their recommendation was to be discharged to home. He does have significant medication noncompliance and likely was not taking his medications per family members and the patient as prescribed. His medication for blood pressure was maximized and his blood pressure control currently is adequate. He will also be discharged on his metformin and a statin. Medication compliance was stressed on multiple occasions. He did have a nodule in his thyroid as described above and his TSH and free thyroxine was checked, both of which were within normal limits. Further imaging studies such as a dedicated thyroid ultrasound could be done as an outpatient for follow-up.   DISPOSITION: Home.   CODE STATUS: FULL CODE.       TOTAL TIME SPENT: 35 minutes.   ____________________________ Vivien Presto, MD sa:drc D: 10/05/2011 14:05:09 ET T: 10/06/2011 10:08:42 ET JOB#: [id number]  cc: Vivien Presto, MD, <Dictator> Arlis Porta., MD Ashok Norris, MD Karel Jarvis [facility] MD ELECTRONICALLY SIGNED 10/10/2011 15:10

## 2014-04-25 NOTE — Discharge Summary (Signed)
PATIENT NAMESHAWNE, [name] MR#:  [id number] DATE OF BIRTH:  1960-03-18  DATE OF ADMISSION:  10/20/2011 DATE OF DISCHARGE:  10/20/2011  PRIMARY CARE PHYSICIAN: Dr. Luan Pulling   IMAGING STUDIES: CT scan of the abdomen and pelvis showed 1 mm stone in the left uretero vesicular junction with mild hydroureteronephrosis.   CONSULTANT: Dr. Jacqlyn Larsen of Urology over the telephone   DISCHARGE DIAGNOSES:  1. Hematuria. 2. Left UVJ 1 mm stone.  3. Recent cerebrovascular accident.   ADMITTING HISTORY AND PHYSICAL: Please see detailed history and physical dictated by Dr. Edwina Barth. In brief, this is a 54 year old Asian gentleman with recent stroke who presented to the Emergency Room complaining of left flank pain and hematuria. The patient was found to have 1 mm UVJ area stone with mild hydroureteronephrosis and hematuria and admitted to the hospitalist service for further work-up and treatment. The patient also had elevated creatinine of 2 with his baseline being 1.   HOSPITAL COURSE:  1. Acute renal failure. The patient was started on IV fluids with which his creatinine is improving and at the time of discharge is at 1.6 and slowly trending down. This was secondary to obstruction and dehydration from decreased intake and nausea and vomiting.  2. Left UVJ 1 mm stone. The patient has been passing debris and is pain free at the time of discharge. Case was discussed with Dr. Jacqlyn Larsen of Urology who suggested outpatient follow-up and pain management at this time. He did not have any signs or symptoms of urinary tract infection.   At the time of discharge the patient's temperature is 97.8, pulse 77, blood pressure 112/74, and is being discharged home in fair condition to follow-up with Dr. Jacqlyn Larsen of Urology and Dr. Luan Pulling, his primary care physician.    DISCHARGE MEDICATIONS:  1. Tramadol 50 mg oral 6 times a day as needed for pain.  2. Zofran ODT 4 mg oral 4 times a day as needed for nausea.  3. Pravastatin 20 mg oral  once a day.  4. Metformin 1000 mg oral once a day.  5. Aspirin/dipyridamole 25/200 oral 2 times a day.  6. Coreg 6.25 mg oral 2 times a day.   DISCHARGE INSTRUCTIONS:  1. Discontinue losartan. 2. Continue taking Coreg.  3. Follow-up with Dr. Jacqlyn Larsen of Urology in one week and primary care physician in a week.  4. The patient will be on a cardiac diet with activity as tolerated without restrictions.   This plan was discussed with the patient and his daughter at bedside who verbalized understanding and are okay with the plan.   TIME SPENT: Time spent today on this discharge dictation along with coordinating care and counseling of the patient was 38 minutes.   ____________________________ [name] Johnny Morais, MD srs:drc D: 10/20/2011 14:31:49 ET T: 10/21/2011 09:43:25 ET JOB#: [id number]  cc: Alveta Heimlich R. Darvin Neighbours, MD, <Dictator> Arlis Porta., MD Denice Bors Jacqlyn Larsen, MD Neita Carp MD ELECTRONICALLY SIGNED 11/03/2011 13:57

## 2014-04-25 NOTE — H&P (Signed)
PATIENT NAMEELHADJ, [name] MR#:  [id number] DATE OF BIRTH:  31-Jan-1960  DATE OF ADMISSION:  10/03/2011  PRIMARY CARE PHYSICIAN: He has no local doctor but recently he started seeing a physician in [place] at [facility], Dr. Luan Pulling.  REFERRING PHYSICIAN: Charlesetta Ivory, MD  CHIEF COMPLAINT: Left facial weakness and mild slurred speech.   HISTORY OF PRESENT ILLNESS: [name] is a 54 year old [place] male with past medical history of hypertension, diabetes mellitus type 2, and hypercholesterolemia. He was in his usual state of health until about 11:00 a.m. yesterday, that is more than 12 hours ago, when he developed left facial weakness and also some slurred speech and he had generalized weakness but no focal weakness. Two days ago he had some headache but that subsided. He had no blurring of vision. No double vision. No syncope or near syncope. No chest pain. No palpitations. No vomiting. No diarrhea.   REVIEW OF SYSTEMS: CONSTITUTIONAL: Denies any fever. No chills but he has generalized fatigue. EYES: No blurring of vision. No double vision. ENT: No hearing impairment. No sore throat. No dysphagia. He did not eat today so he could not answer the question if he has any problem with swallowing. CARDIOVASCULAR: No chest pain. No shortness of breath. No edema. No syncope. RESPIRATORY: No cough. No shortness of breath. No chest pain. GASTROINTESTINAL: No abdominal pain. No vomiting. No diarrhea. GENITOURINARY: No dysuria. No frequency of urination. MUSCULOSKELETAL: No joint pain or swelling. No muscular pain or swelling. INTEGUMENTARY: No skin rash. No ulcers. NEUROLOGY: He has left facial weakness and deviation of the tongue towards the left. No seizure activity. No headache today but he had headache yesterday. PSYCHIATRY: No anxiety. No depression. ENDOCRINE: No polyuria or polydipsia. No heat or cold intolerance.   PAST MEDICAL HISTORY:  1. Systemic hypertension.  2. Diabetes mellitus,  type II. 3. Hypercholesterolemia.    PAST SURGICAL HISTORY:  1. Appendectomy.  2. Lithotripsy for kidney stone.   SOCIAL HABITS: Nonsmoker. No history of alcohol or drug abuse.   SOCIAL HISTORY: He works as a Biomedical scientist at [employer]. He is married and living with his wife.   FAMILY HISTORY: His mother suffered from stroke and Alzheimer's dementia. His father died when he was young. He does not have much information about him.   ADMISSION MEDICATIONS: He does not recall the doses, but his daughter who is a Equities trader knows the name of his medications; Carvedilol or Coreg, losartan, and metformin.   ALLERGIES: Toradol causing skin rash.   PHYSICAL EXAMINATION:   VITAL SIGNS: Blood pressure 165/97, respiratory rate 18, pulse 105, temperature 98.3, and oxygen saturation 97%.   GENERAL APPEARANCE: Middle-aged male lying in bed in no acute distress.   HEAD/NECK: No pallor. No icterus. No cyanosis.   EARS, NOSE, AND THROAT: Hearing was normal. Nasal mucosa, lips, and tongue were normal apart from tongue deviation towards the left.  EYES: Eye examination revealed normal eyelids and conjunctivae. Pupils are about 4 to 5 mm, equal and reactive to light.   NECK: Supple. Trachea at midline. No thyromegaly. No cervical masses.   CARDIOVASCULAR: Normal S1 and S2. No S3 or S4. No murmur. No gallop. No carotid bruits.   RESPIRATORY: Normal breathing pattern without use of accessory muscles. No rales. No wheezing.   ABDOMEN: Soft without tenderness. No hepatosplenomegaly. No masses. No hernias.   SKIN: No ulcers. No subcutaneous nodules.   MUSCULOSKELETAL: No joint swelling. No clubbing.   NEUROLOGIC: Cranial nerves  II through XII are intact with exception of left facial droop and tongue deviation towards the left. The uvula appears at midline. There is no other focal motor weakness. There is equal strength in both upper and lower extremities.   PSYCHIATRIC: The patient is  alert and oriented x3. Mood and affect were normal.  LABORATORY, DIAGNOSTIC AND RADIOLOGIC DATA: CAT scan of the head showed foci of basal ganglia region and right frontal lobe small infarcts. There is no evidence of acute intracranial hemorrhage.   EKG showed normal sinus rhythm at rate of 88 per minute. Unremarkable EKG.  Serum glucose 177, BUN 17, creatinine 1.02, sodium 141, and potassium 3.8. Troponin less than 0.02. CBC showed white count of 9000, hemoglobin 14, hematocrit 44, and platelet count 211.   ASSESSMENT:  1. Acute embolic stroke.  2. Systemic hypertension.  3. Hypercholesterolemia.  4. Diabetes mellitus, type II.  PLAN: We will admit the patient to the telemetry unit. Follow up on his neurologic examination. Obtain carotid ultrasound and echocardiogram. Start enteric-coated aspirin 325 mg a day. Optimize blood pressure control and blood sugar control. Check fasting lipid profile and start        atorvastatin 20 mg a day. Consult neurology. I will also consult speech pathology for evaluation. Meanwhile, I will keep him on puree diet.   TIME SPENT EVALUATING THIS PATIENT: More than 55 minutes. ____________________________ Clovis Pu. Lenore Manner, MD amd:slb D: 10/03/2011 00:42:56 ET T: 10/03/2011 07:32:34 ET JOB#: [id number]  cc: Clovis Pu. Lenore Manner, MD, <Dictator> PCP - Dr. Luan Pulling [facility]) [place] MD ELECTRONICALLY SIGNED 10/03/2011 22:30

## 2014-04-25 NOTE — Consult Note (Signed)
PATIENT NAMEKAMARIUS, [name] MR#:  [id number] DATE OF BIRTH:  Nov 21, 1960  DATE OF CONSULTATION:  10/04/2011  REFERRING PHYSICIAN:  Dr. Lenore Manner  CONSULTING PHYSICIAN:  Rudell Cobb. Loletta Specter, MD  HISTORY: [name] is a 54 year old right-handed married Asian American native of [place], [employer], patient of Dr. Luan Hoffman at [facility], with history of hypertension, hyperlipidemia, and adult onset diabetes mellitus. He was admitted early 10/03/2011 with left facial weakness and mildly slurred speech. He is referred for evaluation of stroke. History comes in a limited fashion from the patient, from telephone conversation with his son, and from his hospital chart.   The patient came to the Emergency Room at 8:40 p.m. on 10/02/2011 with concern regarding difficulty with speech and left lower facial weakness noted on awakening in the late morning. Per the admission note, it appears that history at that time came from his daughter who is a Equities trader.   The patient had had a headache the day before which had cleared. From conversation with the patient's son by telephone, the patient had not been taking his medications on a regular basis recently and had just recently started with Dr. Luan Hoffman as primary care physician.   The patient's facial weakness and speech slurring are described as having significantly improved in the hospital, not fully cleared. He has had no associated weakness of limbs or problems walking. He had not had trouble chewing or swallowing. He has no prior history of symptoms suggestive of stroke or temporary stroke.   On arrival to the Emergency Room, blood pressure 165/97 with heart rate 105 and oxygen saturation at 97%. Admission laboratory studies were notable for a glucose of 177. On brain MRI scan, he was seen to have small area of altered signal intensity on diffusion imaging consistent with recent nonhemorrhagic stroke of the right putamen area.   PHYSICAL  EXAMINATION: The patient is a well developed, well nourished Asian American gentleman initially examined lying semisupine in no apparent distress. He was normocephalic without evidence of trauma and his neck was supple with normal range of motion. He was alert and responsive and following commands well to visual and tactile cues without difficulty with normal extremity strength and coordination, symmetric reflexes, normal gait and sensation of extremities. On cranial nerve examination, there was mild decrease of left nasolabial fold. He was seen to have mild left upper motor neuron facial weakness with conversation and directed movements. Facial sensation, eye movements, visual field to finger count, hearing acuity to finger rubs were normal. The tongue deviated to the left with protrusion. He had normal midline uvula elevation with phonation.   He appeared to have some very limited understanding when addressed in [place]. With three-way conference call telephone contact involving the son, the patient was oriented and lucid and a good historian with speech reportedly still very mildly slurred but much improved compared with time of admission.   IMPRESSION: Left facial weakness and slurred speech associated with small recent lacunar stroke of the right putamen area consistent with small penetrating artery infarction, mostly likely low flow rather than embolic process in a patient with risk factors for atherosclerotic cerebrovascular disease including reported suboptimal compliance to medical regimen recently.   RECOMMENDATIONS:  1. I agree with his present work-up and treatment in the hospital including antiplatelet therapy.  2. The patient is strongly encouraged through his son to pursue good control of his blood sugar and blood pressure while working with Dr. Luan Hoffman in this regard. I  recommend that he obtain a home blood pressure device to check pressures at home.   I appreciate being asked to see this  pleasant and interesting gentleman.   ____________________________ Rudell Cobb. Loletta Specter, MD prc:drc D: 10/06/2011 11:27:37 ET T: 10/06/2011 11:42:36 ET JOB#: [id number]  cc: Rudell Cobb. Loletta Specter, MD, <Dictator> Arlis Porta., MD Linton Flemings MD ELECTRONICALLY SIGNED 10/16/2011 10:41

## 2014-06-15 ENCOUNTER — Telehealth: Payer: Self-pay

## 2014-06-15 NOTE — Telephone Encounter (Signed)
-----   Message from Otilio Jefferson sent at 06/12/2014  9:53 AM EDT ----- Regarding: colonoscopy From: Tia Masker Sent: 05/29/2014  triage

## 2014-06-15 NOTE — Telephone Encounter (Signed)
LVM for pt to return my call.

## 2014-06-26 NOTE — Telephone Encounter (Signed)
Mailed letter to pt to call and schedule colonoscopy.

## 2014-08-31 ENCOUNTER — Other Ambulatory Visit: Payer: Self-pay | Admitting: Family Medicine

## 2014-08-31 MED ORDER — PRAVASTATIN SODIUM 40 MG PO TABS: 40.0000 mg | ORAL_TABLET | Freq: Every day | ORAL | Status: DC

## 2014-08-31 MED ORDER — CARVEDILOL 6.25 MG PO TABS: ORAL_TABLET | ORAL | Status: DC

## 2014-09-01 ENCOUNTER — Other Ambulatory Visit: Payer: Self-pay | Admitting: Family Medicine

## 2014-09-01 MED ORDER — CARVEDILOL 3.125 MG PO TABS: ORAL_TABLET | ORAL | Status: DC

## 2014-09-01 MED ORDER — PRAVASTATIN SODIUM 40 MG PO TABS: 40.0000 mg | ORAL_TABLET | Freq: Every day | ORAL | Status: DC

## 2014-09-26 ENCOUNTER — Encounter: Payer: Self-pay | Admitting: General Surgery

## 2014-09-26 ENCOUNTER — Ambulatory Visit (INDEPENDENT_AMBULATORY_CARE_PROVIDER_SITE_OTHER): Payer: BLUE CROSS/BLUE SHIELD | Admitting: Family Medicine

## 2014-09-26 ENCOUNTER — Encounter: Payer: Self-pay | Admitting: Family Medicine

## 2014-09-26 ENCOUNTER — Ambulatory Visit: Payer: BLUE CROSS/BLUE SHIELD

## 2014-09-26 ENCOUNTER — Ambulatory Visit (INDEPENDENT_AMBULATORY_CARE_PROVIDER_SITE_OTHER): Payer: BLUE CROSS/BLUE SHIELD | Admitting: General Surgery

## 2014-09-26 VITALS — BP 109/75 | HR 79 | Temp 97.6°F | Resp 16 | Ht 70.0 in | Wt 145.0 lb

## 2014-09-26 VITALS — BP 124/82 | HR 70 | Resp 12 | Ht 68.0 in | Wt 144.0 lb

## 2014-09-26 DIAGNOSIS — E041 Nontoxic single thyroid nodule: Secondary | ICD-10-CM

## 2014-09-26 DIAGNOSIS — I10 Essential (primary) hypertension: Secondary | ICD-10-CM | POA: Diagnosis not present

## 2014-09-26 DIAGNOSIS — I63031 Cerebral infarction due to thrombosis of right carotid artery: Secondary | ICD-10-CM

## 2014-09-26 DIAGNOSIS — Z1211 Encounter for screening for malignant neoplasm of colon: Secondary | ICD-10-CM | POA: Diagnosis not present

## 2014-09-26 DIAGNOSIS — Z8673 Personal history of transient ischemic attack (TIA), and cerebral infarction without residual deficits: Secondary | ICD-10-CM | POA: Insufficient documentation

## 2014-09-26 DIAGNOSIS — Z23 Encounter for immunization: Secondary | ICD-10-CM | POA: Diagnosis not present

## 2014-09-26 DIAGNOSIS — E119 Type 2 diabetes mellitus without complications: Secondary | ICD-10-CM | POA: Diagnosis not present

## 2014-09-26 LAB — POCT GLYCOSYLATED HEMOGLOBIN (HGB A1C): Hemoglobin A1C: 6.3

## 2014-09-26 NOTE — Patient Instructions (Signed)
The patient is aware to call back for any questions or concerns.  

## 2014-09-26 NOTE — Patient Instructions (Signed)
Continue current meds 

## 2014-09-26 NOTE — Progress Notes (Signed)
Name: [name]   MRN: [id number]    DOB: 01-15-1960   Date:09/26/2014       Progress Note  Subjective  Chief Complaint  Chief Complaint  Patient presents with  . Diabetes    HPI  Here for f/u of DM.  Also had HBP and elevated lipids.  S/p CVA 3 yrs ago.  Sequelae resolved except mild L. Facial droop.  Has hx. Of thyroid cyst.  It has gotten smaller and requires no more f/u per Dr. Marlyn Corporal. No problem-specific assessment & plan notes found for this encounter.   Past Medical History  Diagnosis Date  . Hypertension 2010  . Diabetes mellitus without complication 0867  . Stroke 2013  . Cyst of thyroid 2013    right  . Kidney stones   . Hyperlipidemia     Social History  Substance Use Topics  . Smoking status: Never Smoker   . Smokeless tobacco: Never Used  . Alcohol Use: No     Current outpatient prescriptions:  .  AGGRENOX 25-200 MG per 12 hr capsule, 1 CAPSULE ER 12 HRS, ORALLY TWO TIMES DAILY, Disp: , Rfl: 11 .  carvedilol (COREG) 3.125 MG tablet, Take 1/2 tablet orally twice a day., Disp: 90 tablet, Rfl: 3 .  metFORMIN (GLUCOPHAGE) 500 MG tablet, Take 500 mg by mouth daily., Disp: , Rfl: 3 .  pravastatin (PRAVACHOL) 40 MG tablet, Take 1 tablet (40 mg total) by mouth daily., Disp: 90 tablet, Rfl: 3  Allergies  Allergen Reactions  . Toradol [Ketorolac Tromethamine] Rash    Review of Systems  Constitutional: Negative for fever, chills, weight loss, malaise/fatigue and diaphoresis.  HENT: Negative for hearing loss.   Eyes: Negative for blurred vision and double vision.  Respiratory: Negative for cough, sputum production, shortness of breath and wheezing.   Cardiovascular: Negative for chest pain, palpitations, orthopnea and leg swelling.  Gastrointestinal: Negative for heartburn, nausea, vomiting, abdominal pain, diarrhea and blood in stool.  Genitourinary: Negative for dysuria, urgency and frequency.  Musculoskeletal: Negative for myalgias and joint pain.   Neurological: Positive for dizziness, tingling and focal weakness (L. facial droopp and reported minimal L. arm and leg weakness.). Negative for sensory change and headaches.      Objective  Filed Vitals:   09/26/14 1346  BP: 109/75  Pulse: 79  Temp: 97.6 F (36.4 C)  TempSrc: Oral  Resp: 16  Height: 5\' 10"  (1.778 m)  Weight: 145 lb (65.772 kg)     Physical Exam  Constitutional: He is oriented to person, place, and time and well-developed, well-nourished, and in no distress. No distress.  HENT:  Head: Normocephalic and atraumatic.  Eyes: Conjunctivae and EOM are normal. Pupils are equal, round, and reactive to light. No scleral icterus.  Neck: Normal range of motion. Neck supple. Carotid bruit is not present. No thyromegaly present.  Cardiovascular: Normal rate, regular rhythm, normal heart sounds and intact distal pulses.  Exam reveals no gallop and no friction rub.   No murmur heard. Pulmonary/Chest: Effort normal and breath sounds normal. No respiratory distress. He has no rales.  Abdominal: Soft. Bowel sounds are normal. He exhibits no distension, no abdominal bruit and no mass. There is no tenderness.  Musculoskeletal: He exhibits no edema.  Lymphadenopathy:    He has no cervical adenopathy.  Neurological: He is alert and oriented to person, place, and time. A cranial nerve deficit (minimal L. facial droop) is present. He exhibits normal muscle tone. Gait normal. Coordination normal.  Vitals  reviewed.     No results found for this or any previous visit (from the past 2160 hour(s)).   Assessment & Plan  1. Diabetes mellitus without complication  - POCT HgB A1C  2. Need for influenza vaccination  - Flu Vaccine QUAD 36+ mos PF IM (Fluarix & Fluzone Quad PF)  3. Encounter for screening colonoscopy  - Ambulatory referral to General Surgery  4. Essential hypertension   5. Thyroid nodule   6. Cerebral infarction due to thrombosis of right carotid  artery    Continue all current meds at current doses.

## 2014-09-26 NOTE — Progress Notes (Signed)
Patient ID: [name], male   DOB: [birth year], 54 y.o.   MRN: [id number]  Chief Complaint  Patient presents with  . Follow-up    thyroid ultrasond    HPI [name] is a 54 y.o. male.  Here today for follow up thyroid ultrasound. Denies any pain. Denies any trouble swallowing. Here today with his daughter [name] and [place] interpreter. Interpreter, Jounette. present for interview, exam and discussion.    HPI  Past Medical History  Diagnosis Date  . Hypertension 2010  . Diabetes mellitus without complication 2297  . Stroke 2013  . Cyst of thyroid 2013    right  . Kidney stones   . Hyperlipidemia     Past Surgical History  Procedure Laterality Date  . Appendectomy  2003  . Fine needle aspiration Right 09-28-13    thyroid nodule/BENIGN FOLLICULAR CELLS, COLLOID    No family history on file.  Social History Social History  Substance Use Topics  . Smoking status: Never Smoker   . Smokeless tobacco: Never Used  . Alcohol Use: No    Allergies  Allergen Reactions  . Toradol [Ketorolac Tromethamine] Rash    Current Outpatient Prescriptions  Medication Sig Dispense Refill  . AGGRENOX 25-200 MG per 12 hr capsule 1 CAPSULE ER 12 HRS, ORALLY TWO TIMES DAILY  11  . carvedilol (COREG) 3.125 MG tablet Take 1/2 tablet orally twice a day. 90 tablet 3  . metFORMIN (GLUCOPHAGE) 500 MG tablet Take 500 mg by mouth daily.  3  . pravastatin (PRAVACHOL) 40 MG tablet Take 1 tablet (40 mg total) by mouth daily. 90 tablet 3   No current facility-administered medications for this visit.    Review of Systems Review of Systems  Constitutional: Negative.   Respiratory: Negative.   Cardiovascular: Negative.     Blood pressure 124/82, pulse 70, resp. rate 12, height 5\' 8"  (1.727 m), weight 144 lb (65.318 kg).  Physical Exam Physical Exam  Constitutional: He is oriented to person, place, and time. He appears well-developed and well-nourished.  HENT:  Mouth/Throat:  Oropharynx is clear and moist.  Eyes: Conjunctivae are normal. No scleral icterus.  Neck: Neck supple.  Cardiovascular: Normal rate, regular rhythm and normal heart sounds.   Pulmonary/Chest: Effort normal and breath sounds normal.  Lymphadenopathy:    He has no cervical adenopathy.  Neurological: He is alert and oriented to person, place, and time.  Skin: Skin is warm and dry.  Psychiatric: His behavior is normal.    Data Reviewed 09/28/2013 FNA: DIAGNOSIS: Comment   Comments: RIGHT THYROID NEGATIVE FOR MALIGNANT CELLS. SPECIMEN CONSISTS OF BENIGN FOLLICULAR CELLS, COLLOID, AND BLOOD. THIS PATTERN IS CONSISTENT WITH A COLLOID NODULE.   Signed out by: Comment   Comments: Galvin Proffer, MD, Pathologist        Thyroid ultrasound was completed to reassess the previously identified nodule in the right lobe.  Overall dimensions of the right lobe is 1.09 x 1.32 x 3.32 cm. Within this is a well-defined nodule now measuring 0.69 x 0.7 to by 0.99 cm. This has decreased in size from 1.1 x 1.1 x 1.3 cm at the time of his fall 2015 exam. There is evidence of vascular flow within the lesion. Previous FNA was negative.  In the right side of the thyroid isthmus is a small cyst measuring 0.2 x 0.3 x 0.38 cm. This is unchanged from past exams.  The left lobe of the thyroid measures 1.0 x 1.44 x 4.05 cm. Located  in the inferior aspect of the left lobe is a 0.44 x 0.45 x 0.45 nodule without vascular flow. This lesion previously measured 6 mm in diameter.  Assessment    Benign, asymptomatic thyroid nodule. Patient previously noted to be euthyroid.    Plan    Routine follow-up ultrasound is no longer indicated with stability documented over 2 years. The patient should have a TSH as part of his annual laboratory studies.  The patient's daughter, an ICU nurse in [place] was present for the interview and exam as well as the interpreter.     Follow up as needed. The patient is aware to call  back for any questions or concerns.   PCP:  Gibson Ramp 09/27/2014, 6:27 AM

## 2014-09-27 DIAGNOSIS — E041 Nontoxic single thyroid nodule: Secondary | ICD-10-CM | POA: Insufficient documentation

## 2014-10-04 ENCOUNTER — Encounter: Payer: Self-pay | Admitting: *Deleted

## 2014-10-16 ENCOUNTER — Ambulatory Visit: Payer: BLUE CROSS/BLUE SHIELD | Admitting: General Surgery

## 2014-11-06 ENCOUNTER — Ambulatory Visit: Payer: BLUE CROSS/BLUE SHIELD | Admitting: General Surgery

## 2014-11-20 ENCOUNTER — Ambulatory Visit: Payer: BLUE CROSS/BLUE SHIELD | Admitting: General Surgery

## 2015-01-11 ENCOUNTER — Encounter: Payer: Self-pay | Admitting: *Deleted

## 2015-01-30 ENCOUNTER — Encounter: Payer: Self-pay | Admitting: Family Medicine

## 2015-01-30 ENCOUNTER — Ambulatory Visit (INDEPENDENT_AMBULATORY_CARE_PROVIDER_SITE_OTHER): Payer: BLUE CROSS/BLUE SHIELD | Admitting: Family Medicine

## 2015-01-30 VITALS — BP 110/76 | HR 73 | Temp 97.9°F | Resp 16 | Wt 149.2 lb

## 2015-01-30 DIAGNOSIS — E119 Type 2 diabetes mellitus without complications: Secondary | ICD-10-CM

## 2015-01-30 DIAGNOSIS — I10 Essential (primary) hypertension: Secondary | ICD-10-CM

## 2015-01-30 DIAGNOSIS — E785 Hyperlipidemia, unspecified: Secondary | ICD-10-CM

## 2015-01-30 DIAGNOSIS — E1121 Type 2 diabetes mellitus with diabetic nephropathy: Secondary | ICD-10-CM | POA: Insufficient documentation

## 2015-01-30 DIAGNOSIS — I63031 Cerebral infarction due to thrombosis of right carotid artery: Secondary | ICD-10-CM | POA: Diagnosis not present

## 2015-01-30 DIAGNOSIS — E1169 Type 2 diabetes mellitus with other specified complication: Secondary | ICD-10-CM | POA: Insufficient documentation

## 2015-01-30 LAB — POCT GLYCOSYLATED HEMOGLOBIN (HGB A1C): HEMOGLOBIN A1C: 6.4

## 2015-01-30 NOTE — Patient Instructions (Signed)
Plan labs on return

## 2015-01-30 NOTE — Progress Notes (Signed)
Name: [name]   MRN: [id number]    DOB: 18-Jun-1960   Date:01/30/2015       Progress Note  Subjective  Chief Complaint  Chief Complaint  Patient presents with  . Diabetes    09/2014  6.3  . Hypertension    HPI  Here for f/u of  DM, HBP, elevated lipids.   He is S/p C VA several years ago with minimal sequelae (some mild facial droop).  Feeling well.   Taking all meds. No problem-specific assessment & plan notes found for this encounter.   Past Medical History  Diagnosis Date  . Hypertension 2010  . Diabetes mellitus without complication ([place]) [id number]  . Stroke [facility]) 2013  . Cyst of thyroid 2013    right  . Kidney stones   . Hyperlipidemia     Past Surgical History  Procedure Laterality Date  . Appendectomy  2003  . Fine needle aspiration Right 09-28-13    thyroid nodule/BENIGN FOLLICULAR CELLS, COLLOID    History reviewed. No pertinent family history.  Social History   Social History  . Marital Status: Married    Spouse Name: N/A  . Number of Children: N/A  . Years of Education: N/A   Occupational History  . Not on file.   Social History Main Topics  . Smoking status: Never Smoker   . Smokeless tobacco: Never Used  . Alcohol Use: No  . Drug Use: No  . Sexual Activity: Not on file   Other Topics Concern  . Not on file   Social History Narrative     Current outpatient prescriptions:  .  AGGRENOX 25-200 MG per 12 hr capsule, 1 CAPSULE ER 12 HRS, ORALLY TWO TIMES DAILY, Disp: , Rfl: 11 .  carvedilol (COREG) 3.125 MG tablet, Take 1/2 tablet orally twice a day., Disp: 90 tablet, Rfl: 3 .  metFORMIN (GLUCOPHAGE) 500 MG tablet, Take 500 mg by mouth daily., Disp: , Rfl: 3 .  pravastatin (PRAVACHOL) 40 MG tablet, Take 1 tablet (40 mg total) by mouth daily., Disp: 90 tablet, Rfl: 3  Allergies  Allergen Reactions  . Toradol [Ketorolac Tromethamine] Rash     Review of Systems  Constitutional: Negative for fever, chills, weight loss and malaise/fatigue.   HENT: Negative for hearing loss.   Eyes: Negative for blurred vision and double vision.  Respiratory: Negative for cough, shortness of breath and wheezing.   Cardiovascular: Negative for chest pain, palpitations and leg swelling.  Gastrointestinal: Negative for heartburn, abdominal pain and blood in stool.  Genitourinary: Negative for dysuria, urgency and frequency.  Musculoskeletal: Negative for myalgias.  Skin: Negative for rash.  Neurological: Negative for dizziness, tremors, sensory change, focal weakness, weakness and headaches.      Objective  Filed Vitals:   01/30/15 0949  BP: 110/76  Pulse: 73  Temp: 97.9 F (36.6 C)  Resp: 16  Weight: 149 lb 3.2 oz (67.677 kg)    Physical Exam  Constitutional: He is oriented to person, place, and time and well-developed, well-nourished, and in no distress. No distress.  HENT:  Head: Normocephalic and atraumatic.  Eyes: Conjunctivae and EOM are normal. Pupils are equal, round, and reactive to light. No scleral icterus.  Neck: Normal range of motion. Neck supple. Carotid bruit is not present. No thyromegaly present.  Cardiovascular: Normal rate, regular rhythm and normal heart sounds.  Exam reveals no gallop and no friction rub.   No murmur heard. Pulmonary/Chest: Effort normal and breath sounds normal. No respiratory distress.  He has no wheezes. He has no rales.  Abdominal: Soft. Bowel sounds are normal. He exhibits no distension, no abdominal bruit and no mass. There is no tenderness.  Musculoskeletal: He exhibits no edema.  Lymphadenopathy:    He has no cervical adenopathy.  Neurological: He is alert and oriented to person, place, and time.  Strength equal and normal in bilateral upper and lower extremities.  Very minimal facial droop on L.  Vitals reviewed.      Recent Results (from the past 2160 hour(s))  POCT HgB A1C     Status: Abnormal   Collection Time: 01/30/15 10:20 AM  Result Value Ref Range   Hemoglobin A1C 6.4       Assessment & Plan  Problem List Items Addressed This Visit      Cardiovascular and Mediastinum   Hypertension     Endocrine   Diabetes mellitus without complication ([place])   Type 2 diabetes mellitus without complication, without long-term current use of insulin (HCC)   Relevant Orders   POCT HgB A1C (Completed)     Nervous and Auditory   CVA (cerebral infarction)     Other   Hyperlipidemia - Primary      No orders of the defined types were placed in this encounter.   1. Type 2 diabetes mellitus without complication, without long-term current use of insulin (HCC)  - POCT HgB A1C-6.4 Cont. med 2. Diabetes mellitus without complication ([place])   3. Essential hypertension Cont. med  4. Cerebral infarction due to thrombosis of right carotid artery (HCC) Cont. med  5. Hyperlipidemia Cont. med

## 2015-02-05 ENCOUNTER — Telehealth: Payer: Self-pay | Admitting: Family Medicine

## 2015-02-05 ENCOUNTER — Other Ambulatory Visit: Payer: Self-pay | Admitting: Family Medicine

## 2015-02-05 NOTE — Telephone Encounter (Signed)
[name]  Daughter) called requesting a refill on  Aggrenox, [facility]  Carolin Sicks call back # is [phone]

## 2015-02-05 NOTE — Telephone Encounter (Signed)
I have already refilled this.-jh

## 2015-03-12 ENCOUNTER — Other Ambulatory Visit: Payer: Self-pay | Admitting: *Deleted

## 2015-03-12 ENCOUNTER — Telehealth: Payer: Self-pay | Admitting: *Deleted

## 2015-03-12 MED ORDER — ASPIRIN-DIPYRIDAMOLE ER 25-200 MG PO CP12: 1.0000 | ORAL_CAPSULE | Freq: Two times a day (BID) | ORAL | Status: DC

## 2015-03-12 NOTE — Telephone Encounter (Signed)
Patient's daughter called back and asked that rx brand be faxed to [facility] [phone]. Rx printed, signed and faxed.

## 2015-03-12 NOTE — Telephone Encounter (Signed)
Ok-jh 

## 2015-03-12 NOTE — Telephone Encounter (Signed)
[facility] called per patient request to change name brand Aggrenox to generic. Is it ok brand too expensive?

## 2015-05-09 ENCOUNTER — Other Ambulatory Visit: Payer: Self-pay | Admitting: Family Medicine

## 2015-05-29 ENCOUNTER — Encounter: Payer: Self-pay | Admitting: Family Medicine

## 2015-05-29 ENCOUNTER — Ambulatory Visit (INDEPENDENT_AMBULATORY_CARE_PROVIDER_SITE_OTHER): Payer: BLUE CROSS/BLUE SHIELD | Admitting: Family Medicine

## 2015-05-29 VITALS — BP 110/76 | HR 76 | Temp 98.0°F | Resp 16 | Ht 70.0 in | Wt 142.0 lb

## 2015-05-29 DIAGNOSIS — I6340 Cerebral infarction due to embolism of unspecified cerebral artery: Secondary | ICD-10-CM

## 2015-05-29 DIAGNOSIS — E785 Hyperlipidemia, unspecified: Secondary | ICD-10-CM | POA: Diagnosis not present

## 2015-05-29 DIAGNOSIS — E119 Type 2 diabetes mellitus without complications: Secondary | ICD-10-CM | POA: Diagnosis not present

## 2015-05-29 DIAGNOSIS — I10 Essential (primary) hypertension: Secondary | ICD-10-CM

## 2015-05-29 LAB — POCT GLYCOSYLATED HEMOGLOBIN (HGB A1C)

## 2015-05-29 MED ORDER — METFORMIN HCL 500 MG PO TABS: 500.0000 mg | ORAL_TABLET | Freq: Two times a day (BID) | ORAL | Status: DC

## 2015-05-29 NOTE — Progress Notes (Signed)
Name: [name]   MRN: [id number]    DOB: 1960/05/04   Date:05/29/2015       Progress Note  Subjective  Chief Complaint  Chief Complaint  Patient presents with  . Diabetes  . Hyperlipidemia    HPI Here for f/u of HBP, DM,  Elevated lipids.  HE is S/P CVA several years ago with resultant mild L facial droop.  Feeling well overall.    No problem-specific assessment & plan notes found for this encounter.   Past Medical History  Diagnosis Date  . Hypertension 2010  . Diabetes mellitus without complication ([place]) [id number]  . Stroke [facility]) 2013  . Cyst of thyroid 2013    right  . Kidney stones   . Hyperlipidemia     Past Surgical History  Procedure Laterality Date  . Appendectomy  2003  . Fine needle aspiration Right 09-28-13    thyroid nodule/BENIGN FOLLICULAR CELLS, COLLOID    Family History  Problem Relation Age of Onset  . Family history unknown: Yes    Social History   Social History  . Marital Status: Married    Spouse Name: N/A  . Number of Children: N/A  . Years of Education: N/A   Occupational History  . Not on file.   Social History Main Topics  . Smoking status: Never Smoker   . Smokeless tobacco: Never Used  . Alcohol Use: No  . Drug Use: No  . Sexual Activity: Not on file   Other Topics Concern  . Not on file   Social History Narrative     Current outpatient prescriptions:  .  carvedilol (COREG) 3.125 MG tablet, Take 1/2 tablet orally twice a day., Disp: 90 tablet, Rfl: 3 .  dipyridamole-aspirin (AGGRENOX) 200-25 MG 12hr capsule, Take 1 capsule by mouth 2 (two) times daily., Disp: 60 capsule, Rfl: 12 .  metFORMIN (GLUCOPHAGE) 500 MG tablet, Take 1 tablet (500 mg total) by mouth 2 (two) times daily with a meal., Disp: 180 tablet, Rfl: 3 .  pravastatin (PRAVACHOL) 40 MG tablet, Take 1 tablet (40 mg total) by mouth daily., Disp: 90 tablet, Rfl: 3  Allergies  Allergen Reactions  . Toradol [Ketorolac Tromethamine] Rash     Review of Systems   Constitutional: Negative for fever, chills, weight loss and malaise/fatigue.  HENT: Negative for hearing loss.   Eyes: Negative for blurred vision and double vision.  Respiratory: Negative for cough, shortness of breath and wheezing.   Cardiovascular: Negative for chest pain, palpitations and leg swelling.  Gastrointestinal: Negative for heartburn, abdominal pain and blood in stool.  Genitourinary: Negative for dysuria, urgency and frequency.  Skin: Negative for rash.  Neurological: Positive for focal weakness (L face). Negative for dizziness, tingling, tremors, sensory change, weakness and headaches.      Objective  Filed Vitals:   05/29/15 0941  BP: 110/76  Pulse: 76  Temp: 98 F (36.7 C)  TempSrc: Oral  Resp: 16  Height: 5\' 10"  (1.778 m)  Weight: 142 lb (64.411 kg)    Physical Exam  Constitutional: He is oriented to person, place, and time and well-developed, well-nourished, and in no distress. No distress.  HENT:  Head: Normocephalic and atraumatic.  Eyes: Conjunctivae and EOM are normal. Pupils are equal, round, and reactive to light. No scleral icterus.  Neck: Normal range of motion. Neck supple. Carotid bruit is not present. No thyromegaly present.  Cardiovascular: Normal rate, regular rhythm and normal heart sounds.  Exam reveals no gallop and no friction  rub.   No murmur heard. Pulmonary/Chest: Effort normal and breath sounds normal. No respiratory distress. He has no wheezes. He has no rales.  Musculoskeletal: He exhibits no edema.  Lymphadenopathy:    He has no cervical adenopathy.  Neurological: He is alert and oriented to person, place, and time.  Minimal L facial droop  Vitals reviewed.      Recent Results (from the past 2160 hour(s))  POCT HgB A1C     Status: Abnormal   Collection Time: 05/29/15  9:51 AM  Result Value Ref Range   Hemoglobin A1C 7.0%      Assessment & Plan  Problem List Items Addressed This Visit      Cardiovascular and  Mediastinum   Hypertension     Endocrine   Type 2 diabetes mellitus without complication, without long-term current use of insulin (HCC) - Primary   Relevant Medications   metFORMIN (GLUCOPHAGE) 500 MG tablet   Other Relevant Orders   POCT HgB A1C (Completed)     Nervous and Auditory   CVA (cerebral infarction)     Other   Hyperlipidemia      Meds ordered this encounter  Medications  . metFORMIN (GLUCOPHAGE) 500 MG tablet    Sig: Take 1 tablet (500 mg total) by mouth 2 (two) times daily with a meal.    Dispense:  180 tablet    Refill:  3   1. Type 2 diabetes mellitus without complication, without long-term current use of insulin (HCC)  - POCT HgB A1C-7.0 - metFORMIN (GLUCOPHAGE) 500 MG tablet; Take 1 tablet (500 mg total) by mouth 2 (two) times daily with a meal.  Dispense: 180 tablet; Refill: 3  2. Essential hypertension  Cont Carvedilol 3. Hyperlipidemia  Cont. Prevastatin 4. Cerebral infarction due to embolism of cerebral artery (HCC) Cont. aggrenox

## 2015-09-06 ENCOUNTER — Encounter: Payer: Self-pay | Admitting: Family Medicine

## 2015-09-06 ENCOUNTER — Ambulatory Visit (INDEPENDENT_AMBULATORY_CARE_PROVIDER_SITE_OTHER): Payer: BLUE CROSS/BLUE SHIELD | Admitting: Family Medicine

## 2015-09-06 VITALS — BP 131/87 | HR 69 | Temp 98.2°F | Resp 16 | Ht 70.0 in | Wt 143.0 lb

## 2015-09-06 DIAGNOSIS — E785 Hyperlipidemia, unspecified: Secondary | ICD-10-CM

## 2015-09-06 DIAGNOSIS — I639 Cerebral infarction, unspecified: Secondary | ICD-10-CM | POA: Diagnosis not present

## 2015-09-06 DIAGNOSIS — E041 Nontoxic single thyroid nodule: Secondary | ICD-10-CM | POA: Diagnosis not present

## 2015-09-06 DIAGNOSIS — E119 Type 2 diabetes mellitus without complications: Secondary | ICD-10-CM | POA: Diagnosis not present

## 2015-09-06 DIAGNOSIS — I10 Essential (primary) hypertension: Secondary | ICD-10-CM

## 2015-09-06 LAB — POCT GLYCOSYLATED HEMOGLOBIN (HGB A1C): Hemoglobin A1C: 6.6

## 2015-09-06 NOTE — Progress Notes (Signed)
Name: [name]   MRN: [id number]    DOB: [birth year]   Date:09/06/2015       Progress Note  Subjective  Chief Complaint  Chief Complaint  Patient presents with  . Diabetes    HPI  Here for f/u of DM, HBP and elevated lipids.  Has CVA with minimal sequelae (mild mouth droop).  Everything else has returned to normal.  No problem-specific Assessment & Plan notes found for this encounter.   Past Medical History:  Diagnosis Date  . Cyst of thyroid 2013   right  . Diabetes mellitus without complication ([place]) [id number]  . Hyperlipidemia   . Hypertension 2010  . Kidney stones   . Stroke [facility]) 2013    Past Surgical History:  Procedure Laterality Date  . APPENDECTOMY  2003  . FINE NEEDLE ASPIRATION Right 09-28-13   thyroid nodule/BENIGN FOLLICULAR CELLS, COLLOID    Family History  Problem Relation Age of Onset  . Family history unknown: Yes    Social History   Social History  . Marital status: Married    Spouse name: N/A  . Number of children: N/A  . Years of education: N/A   Occupational History  . Not on file.   Social History Main Topics  . Smoking status: Never Smoker  . Smokeless tobacco: Never Used  . Alcohol use No  . Drug use: No  . Sexual activity: Not on file   Other Topics Concern  . Not on file   Social History Narrative  . No narrative on file     Current Outpatient Prescriptions:  .  carvedilol (COREG) 3.125 MG tablet, Take 1/2 tablet orally twice a day., Disp: 90 tablet, Rfl: 3 .  dipyridamole-aspirin (AGGRENOX) 200-25 MG 12hr capsule, Take 1 capsule by mouth 2 (two) times daily., Disp: 60 capsule, Rfl: 12 .  metFORMIN (GLUCOPHAGE) 500 MG tablet, Take 1 tablet (500 mg total) by mouth 2 (two) times daily with a meal., Disp: 180 tablet, Rfl: 3 .  pravastatin (PRAVACHOL) 40 MG tablet, Take 1 tablet (40 mg total) by mouth daily., Disp: 90 tablet, Rfl: 3  Allergies  Allergen Reactions  . Toradol [Ketorolac Tromethamine] Rash     Review of  Systems  Constitutional: Negative for chills, fever, malaise/fatigue and weight loss.  HENT: Negative for hearing loss.   Eyes: Negative for blurred vision and double vision.  Respiratory: Negative for cough, shortness of breath and wheezing.   Cardiovascular: Negative for chest pain, palpitations and leg swelling.  Gastrointestinal: Negative for abdominal pain, blood in stool and heartburn.  Genitourinary: Negative for dysuria, frequency and urgency.  Musculoskeletal: Negative for joint pain and neck pain.  Skin: Negative for rash.  Neurological: Negative for dizziness, tremors, sensory change, focal weakness, weakness and headaches.      Objective  Vitals:   09/06/15 0934 09/06/15 0938  BP:  131/87  Pulse:  69  Resp: 16 16  Temp:  98.2 F (36.8 C)  TempSrc:  Oral  Weight:  143 lb (64.9 kg)  Height: 5\' 10"  (1.778 m) 5\' 10"  (1.778 m)    Physical Exam  Constitutional: He is oriented to person, place, and time and well-developed, well-nourished, and in no distress. No distress.  HENT:  Head: Normocephalic and atraumatic.  Eyes: Conjunctivae and EOM are normal. Pupils are equal, round, and reactive to light. No scleral icterus.  Neck: Normal range of motion. Neck supple. Carotid bruit is not present. No thyromegaly present.  Cardiovascular: Normal rate, regular rhythm  and normal heart sounds.  Exam reveals no gallop and no friction rub.   No murmur heard. Pulmonary/Chest: Effort normal and breath sounds normal. No respiratory distress. He has no wheezes. He has no rales.  Abdominal: Soft. Bowel sounds are normal. He exhibits no distension, no abdominal bruit and no mass. There is no tenderness.  Musculoskeletal: He exhibits no edema.  Lymphadenopathy:    He has no cervical adenopathy.  Neurological: He is alert and oriented to person, place, and time. No cranial nerve deficit. He exhibits normal muscle tone. Gait normal. Coordination normal.  Vitals reviewed.      Recent  Results (from the past 2160 hour(s))  POCT HgB A1C     Status: Abnormal   Collection Time: 09/06/15  9:59 AM  Result Value Ref Range   Hemoglobin A1C 6.6%      Assessment & Plan  Problem List Items Addressed This Visit      Cardiovascular and Mediastinum   Hypertension     Endocrine   Diabetes mellitus without complication ([place]) - Primary   Relevant Orders   POCT HgB A1C (Completed)   COMPLETE METABOLIC PANEL WITH GFR   Thyroid nodule   Relevant Orders   TSH     Nervous and Auditory   CVA (cerebral infarction)   Relevant Orders   CBC with Differential     Other   Hyperlipidemia   Relevant Orders   Lipid Profile    Other Visit Diagnoses   None.     No orders of the defined types were placed in this encounter.  1. Diabetes mellitus without complication (HCC) Cont metformin - POCT HgB A1C-6.6 - COMPLETE METABOLIC PANEL WITH GFR  2. Essential hypertension  Cont Carvedil;ol 3. Thyroid nodule  - TSH  4. Cerebral infarction due to unspecified mechanism Cont Aggrenox - CBC with Differential  5. Hyperlipidemia Cont. Pravastatin - Lipid Profile

## 2015-09-11 ENCOUNTER — Other Ambulatory Visit: Payer: BLUE CROSS/BLUE SHIELD

## 2015-09-11 LAB — CBC WITH DIFFERENTIAL/PLATELET
BASOS PCT: 1 %
Basophils Absolute: 62 cells/uL (ref 0–200)
EOS PCT: 3 %
Eosinophils Absolute: 186 cells/uL (ref 15–500)
HCT: 36.2 % — ABNORMAL LOW (ref 38.5–50.0)
Hemoglobin: 11.8 g/dL — ABNORMAL LOW (ref 13.2–17.1)
LYMPHS PCT: 26 %
Lymphs Abs: 1612 cells/uL (ref 850–3900)
MCH: 30.6 pg (ref 27.0–33.0)
MCHC: 32.6 g/dL (ref 32.0–36.0)
MCV: 93.8 fL (ref 80.0–100.0)
MONOS PCT: 11 %
MPV: 11.1 fL (ref 7.5–12.5)
Monocytes Absolute: 682 cells/uL (ref 200–950)
Neutro Abs: 3658 cells/uL (ref 1500–7800)
Neutrophils Relative %: 59 %
PLATELETS: 237 10*3/uL (ref 140–400)
RBC: 3.86 MIL/uL — AB (ref 4.20–5.80)
RDW: 14.1 % (ref 11.0–15.0)
WBC: 6.2 10*3/uL (ref 3.8–10.8)

## 2015-09-12 LAB — COMPLETE METABOLIC PANEL WITH GFR
ALK PHOS: 56 U/L (ref 40–115)
ALT: 19 U/L (ref 9–46)
AST: 15 U/L (ref 10–35)
Albumin: 3.7 g/dL (ref 3.6–5.1)
BUN: 20 mg/dL (ref 7–25)
CHLORIDE: 108 mmol/L (ref 98–110)
CO2: 24 mmol/L (ref 20–31)
Calcium: 8.6 mg/dL (ref 8.6–10.3)
Creat: 1.2 mg/dL (ref 0.70–1.33)
GFR, EST NON AFRICAN AMERICAN: 67 mL/min (ref >=60)
GFR, Est African American: 78 mL/min (ref >=60)
GLUCOSE: 119 mg/dL — AB (ref 65–99)
POTASSIUM: 3.9 mmol/L (ref 3.5–5.3)
SODIUM: 141 mmol/L (ref 135–146)
Total Bilirubin: 0.5 mg/dL (ref 0.2–1.2)
Total Protein: 6.4 g/dL (ref 6.1–8.1)

## 2015-09-12 LAB — LIPID PANEL
CHOL/HDL RATIO: 2.7 ratio (ref <=5.0)
CHOLESTEROL: 125 mg/dL (ref 125–200)
HDL: 46 mg/dL (ref >=40)
LDL Cholesterol: 47 mg/dL (ref <130)
Triglycerides: 160 mg/dL — ABNORMAL HIGH (ref <150)
VLDL: 32 mg/dL — AB (ref <30)

## 2015-09-12 LAB — TSH: TSH: 2 mIU/L (ref 0.40–4.50)

## 2015-09-17 ENCOUNTER — Other Ambulatory Visit: Payer: Self-pay | Admitting: Family Medicine

## 2016-02-05 ENCOUNTER — Ambulatory Visit (INDEPENDENT_AMBULATORY_CARE_PROVIDER_SITE_OTHER): Payer: BLUE CROSS/BLUE SHIELD | Admitting: Family Medicine

## 2016-02-05 ENCOUNTER — Encounter: Payer: Self-pay | Admitting: Family Medicine

## 2016-02-05 VITALS — BP 121/79 | HR 79 | Temp 97.6°F | Resp 16 | Ht 70.0 in | Wt 143.0 lb

## 2016-02-05 DIAGNOSIS — E119 Type 2 diabetes mellitus without complications: Secondary | ICD-10-CM | POA: Diagnosis not present

## 2016-02-05 DIAGNOSIS — Z23 Encounter for immunization: Secondary | ICD-10-CM

## 2016-02-05 DIAGNOSIS — E784 Other hyperlipidemia: Secondary | ICD-10-CM | POA: Diagnosis not present

## 2016-02-05 DIAGNOSIS — E7849 Other hyperlipidemia: Secondary | ICD-10-CM

## 2016-02-05 DIAGNOSIS — E041 Nontoxic single thyroid nodule: Secondary | ICD-10-CM

## 2016-02-05 DIAGNOSIS — Z0000 Encounter for general adult medical examination without abnormal findings: Secondary | ICD-10-CM

## 2016-02-05 DIAGNOSIS — I639 Cerebral infarction, unspecified: Secondary | ICD-10-CM

## 2016-02-05 DIAGNOSIS — I10 Essential (primary) hypertension: Secondary | ICD-10-CM

## 2016-02-05 NOTE — Progress Notes (Signed)
Name: [name]   MRN: [id number]    DOB: December 06, 1960   Date:02/05/2016       Progress Note  Subjective  Chief Complaint  Chief Complaint  Patient presents with  . Diabetes  . Hypertension  . Hyperlipidemia  . Cerebrovascular Accident    HPI Here for f/u of DM, HBP, elevated lipids and s/p CVA.  He is doing well.  BSs at home 110-120 AM to 150 hs.  He is feeling well and has no c/o    No problem-specific Assessment & Plan notes found for this encounter.   Past Medical History:  Diagnosis Date  . Cyst of thyroid 2013   right  . Diabetes mellitus without complication ([place]) [id number]  . Hyperlipidemia   . Hypertension 2010  . Kidney stones   . Stroke [facility]) 2013    Past Surgical History:  Procedure Laterality Date  . APPENDECTOMY  2003  . FINE NEEDLE ASPIRATION Right 09-28-13   thyroid nodule/BENIGN FOLLICULAR CELLS, COLLOID    Family History  Problem Relation Age of Onset  . Family history unknown: Yes    Social History   Social History  . Marital status: Married    Spouse name: N/A  . Number of children: N/A  . Years of education: N/A   Occupational History  . Not on file.   Social History Main Topics  . Smoking status: Never Smoker  . Smokeless tobacco: Never Used  . Alcohol use No  . Drug use: No  . Sexual activity: Not on file   Other Topics Concern  . Not on file   Social History Narrative  . No narrative on file     Current Outpatient Prescriptions:  .  carvedilol (COREG) 3.125 MG tablet, TAKE 1/2 TABLET ORALLY TWICE A DAY., Disp: 90 tablet, Rfl: 1 .  dipyridamole-aspirin (AGGRENOX) 200-25 MG 12hr capsule, Take 1 capsule by mouth 2 (two) times daily., Disp: 60 capsule, Rfl: 12 .  metFORMIN (GLUCOPHAGE) 500 MG tablet, Take 1 tablet (500 mg total) by mouth 2 (two) times daily with a meal., Disp: 180 tablet, Rfl: 3 .  pravastatin (PRAVACHOL) 40 MG tablet, TAKE 1 TABLET (40 MG TOTAL) BY MOUTH DAILY., Disp: 90 tablet, Rfl: 2  Allergies  Allergen  Reactions  . Toradol [Ketorolac Tromethamine] Rash     Review of Systems  Constitutional: Negative for chills, fever, malaise/fatigue and weight loss.  HENT: Negative for hearing loss and tinnitus.   Eyes: Negative for blurred vision and double vision.  Respiratory: Negative for cough, shortness of breath and wheezing.   Cardiovascular: Negative for chest pain, palpitations and leg swelling.  Gastrointestinal: Negative for abdominal pain, blood in stool and heartburn.  Genitourinary: Negative for dysuria, frequency and urgency.  Musculoskeletal: Negative for joint pain and myalgias.  Skin: Negative for rash.  Neurological: Negative for dizziness, tingling, tremors, weakness and headaches.      Objective  Vitals:   02/05/16 1505  BP: 121/79  Pulse: 79  Resp: 16  Temp: 97.6 F (36.4 C)  TempSrc: Oral  Weight: 143 lb (64.9 kg)  Height: 5\' 10"  (1.778 m)    Physical Exam  Constitutional: He is oriented to person, place, and time and well-developed, well-nourished, and in no distress. No distress.  HENT:  Head: Normocephalic and atraumatic.  Eyes: Conjunctivae and EOM are normal. Pupils are equal, round, and reactive to light. No scleral icterus.  Neck: Normal range of motion. Neck supple. Carotid bruit is not present. No thyromegaly present.  Cardiovascular: Normal rate and regular rhythm.  Exam reveals no gallop and no friction rub.   No murmur heard. Pulmonary/Chest: Effort normal and breath sounds normal. No respiratory distress. He has no wheezes. He has no rales.  Musculoskeletal: He exhibits no edema.  Lymphadenopathy:    He has no cervical adenopathy.  Neurological: He is alert and oriented to person, place, and time.  Vitals reviewed.      No results found for this or any previous visit (from the past 2160 hour(s)).   Assessment & Plan  Problem List Items Addressed This Visit      Cardiovascular and Mediastinum   Hypertension     Endocrine   Diabetes  mellitus without complication ([place])   Relevant Orders   HgB A1c   Thyroid nodule     Nervous and Auditory   Cerebral infarction ([place])     Other   Hyperlipidemia    Other Visit Diagnoses    Need for immunization against influenza    -  Primary   Relevant Orders   Flu Vaccine QUAD 36+ mos PF IM (Fluarix & Fluzone Quad PF) (Completed)   Healthcare maintenance       Relevant Orders   Cologuard      No orders of the defined types were placed in this encounter. 1. Need for immunization against influenza  - Flu Vaccine QUAD 36+ mos PF IM (Fluarix & Fluzone Quad PF)  2. Essential hypertension  cont med 3. Diabetes mellitus without complication ([place]) Cont med - HgB A1c  4. Other hyperlipidemia  cont med 5. Cerebral infarction, unspecified mechanism ([place])   6. Thyroid nodule   7. Healthcare maintenance  - Cologuard

## 2016-02-07 ENCOUNTER — Ambulatory Visit: Payer: BLUE CROSS/BLUE SHIELD | Admitting: Family Medicine

## 2016-02-08 ENCOUNTER — Other Ambulatory Visit: Payer: BLUE CROSS/BLUE SHIELD

## 2016-02-08 ENCOUNTER — Ambulatory Visit: Payer: BLUE CROSS/BLUE SHIELD | Admitting: Family Medicine

## 2016-03-07 ENCOUNTER — Other Ambulatory Visit: Payer: Self-pay | Admitting: *Deleted

## 2016-03-07 MED ORDER — CARVEDILOL 3.125 MG PO TABS: ORAL_TABLET | ORAL | 1 refills | Status: DC

## 2016-03-28 ENCOUNTER — Other Ambulatory Visit: Payer: Self-pay | Admitting: Family Medicine

## 2016-07-04 ENCOUNTER — Other Ambulatory Visit: Payer: Self-pay

## 2016-07-04 DIAGNOSIS — E119 Type 2 diabetes mellitus without complications: Secondary | ICD-10-CM

## 2016-07-04 MED ORDER — METFORMIN HCL 500 MG PO TABS: 500.0000 mg | ORAL_TABLET | Freq: Two times a day (BID) | ORAL | 0 refills | Status: DC

## 2016-07-11 ENCOUNTER — Ambulatory Visit: Payer: BLUE CROSS/BLUE SHIELD | Admitting: Family Medicine

## 2016-07-17 ENCOUNTER — Encounter: Payer: Self-pay | Admitting: Family Medicine

## 2016-07-17 ENCOUNTER — Encounter: Payer: BLUE CROSS/BLUE SHIELD | Admitting: Family Medicine

## 2016-07-17 ENCOUNTER — Ambulatory Visit (INDEPENDENT_AMBULATORY_CARE_PROVIDER_SITE_OTHER): Payer: BLUE CROSS/BLUE SHIELD | Admitting: Family Medicine

## 2016-07-17 VITALS — BP 125/83 | HR 68 | Temp 97.5°F | Resp 16 | Ht 70.0 in | Wt 143.0 lb

## 2016-07-17 DIAGNOSIS — I10 Essential (primary) hypertension: Secondary | ICD-10-CM

## 2016-07-17 DIAGNOSIS — Z0000 Encounter for general adult medical examination without abnormal findings: Secondary | ICD-10-CM

## 2016-07-17 DIAGNOSIS — Z1211 Encounter for screening for malignant neoplasm of colon: Secondary | ICD-10-CM | POA: Diagnosis not present

## 2016-07-17 DIAGNOSIS — D649 Anemia, unspecified: Secondary | ICD-10-CM | POA: Insufficient documentation

## 2016-07-17 DIAGNOSIS — L308 Other specified dermatitis: Secondary | ICD-10-CM

## 2016-07-17 DIAGNOSIS — E119 Type 2 diabetes mellitus without complications: Secondary | ICD-10-CM | POA: Diagnosis not present

## 2016-07-17 DIAGNOSIS — Z8673 Personal history of transient ischemic attack (TIA), and cerebral infarction without residual deficits: Secondary | ICD-10-CM

## 2016-07-17 DIAGNOSIS — Z125 Encounter for screening for malignant neoplasm of prostate: Secondary | ICD-10-CM | POA: Insufficient documentation

## 2016-07-17 DIAGNOSIS — E785 Hyperlipidemia, unspecified: Secondary | ICD-10-CM

## 2016-07-17 DIAGNOSIS — E1169 Type 2 diabetes mellitus with other specified complication: Secondary | ICD-10-CM

## 2016-07-17 DIAGNOSIS — Z23 Encounter for immunization: Secondary | ICD-10-CM

## 2016-07-17 DIAGNOSIS — E041 Nontoxic single thyroid nodule: Secondary | ICD-10-CM | POA: Diagnosis not present

## 2016-07-17 DIAGNOSIS — D509 Iron deficiency anemia, unspecified: Secondary | ICD-10-CM

## 2016-07-17 DIAGNOSIS — Z1159 Encounter for screening for other viral diseases: Secondary | ICD-10-CM | POA: Diagnosis not present

## 2016-07-17 LAB — POCT UA - MICROALBUMIN: MICROALBUMIN (UR) POC: 0 mg/L

## 2016-07-17 LAB — POCT GLYCOSYLATED HEMOGLOBIN (HGB A1C): HEMOGLOBIN A1C: 6.3 — AB (ref ?–5.7)

## 2016-07-17 MED ORDER — PRAVASTATIN SODIUM 40 MG PO TABS: 40.0000 mg | ORAL_TABLET | Freq: Every day | ORAL | 3 refills | Status: DC

## 2016-07-17 MED ORDER — TRIAMCINOLONE ACETONIDE 0.5 % EX CREA: 1.0000 "application " | TOPICAL_CREAM | Freq: Two times a day (BID) | CUTANEOUS | 2 refills | Status: DC

## 2016-07-17 MED ORDER — CARVEDILOL 3.125 MG PO TABS: ORAL_TABLET | ORAL | 3 refills | Status: DC

## 2016-07-17 MED ORDER — METFORMIN HCL 500 MG PO TABS: 500.0000 mg | ORAL_TABLET | Freq: Two times a day (BID) | ORAL | 3 refills | Status: DC

## 2016-07-17 MED ORDER — LISINOPRIL 2.5 MG PO TABS: 2.5000 mg | ORAL_TABLET | Freq: Every day | ORAL | 3 refills | Status: DC

## 2016-07-17 NOTE — Assessment & Plan Note (Signed)
Check TSH, clinically without problem or symptoms

## 2016-07-17 NOTE — Patient Instructions (Addendum)
Thank you for coming to the clinic today.  1. Keep up the good work! - A1c 6.3, very good. Continue Metformin. - Try to be cautious with high sugar drinks and fruit juice - Monitor blood sugar and pressure as you are - Start new Lisiniopril 2.68m daily - for protecting kidneys, should not lower BP too much, call u[place]oreaif it does - Continue Aggrenox and Statin  2. Colon Cancer Screening: - For all adults age 57+routine colon cancer screening is highly recommended.     - Recent guidelines from [place] starting age of 421- Early detection of colon cancer is important, because often there are no warning signs or symptoms, also if found early usually it can be cured. Late stage is hard to treat.  - If you are not interested in Colonoscopy screening (if done and normal you could be cleared for 5 to 10 years until next due), then Cologuard is an excellent alternative for screening test for Colon Cancer. It is highly sensitive for detecting DNA of colon cancer from even the earliest stages. Also, there is NO bowel prep required. - If Cologuard is NEGATIVE, then it is good for 3 years before next due - If Cologuard is POSITIVE, then it is strongly advised to get a Colonoscopy, which allows the GI doctor to locate the source of the cancer or polyp (even very early stage) and treat it by removing it. ------------------------- If you would like to proceed with Cologuard (stool DNA test) - FIRST, call your insurance company and tell them you want to check cost of Cologuard tell them CPT Code [phone](it may be completely covered and you could get for no cost, OR max cost without any coverage is about $600). Also, keep in mind if you do NOT open the kit, and decide not to do the test, you will NOT be charged, you should contact the company if you decide not to do the test. - If you want to proceed, you can notify [place](phone message, [place] or at next visit) and we will order it  for you. The test kit will be delivered to you house within about 1 week. Follow instructions to collect sample, you may call the company for any help or questions, 24/7 telephone support at [phone]  ---- Remember to schedule Optometry Diabetic Eye Exam - fax u[place]acopy of report  Use the topical steroid creams twice a day for up to 1 week, maximum duration of use per one flare is 10 to 14 days, then STOP using it and allow skin to recover. Caution with over-use may cause lightening of the skin.   Hydrocortisone on face only and the Triamcinolone / Kenalog on body only.  Tips to reduce Eczema Flares: For baths/showers, limit bathing to every other day if you can (max 1 x daily)  Use a gentle, unscented soap and lukewarm water (hot water is most irritating to skin) Never scrub skin with too much pressure, this causes more irritation. Pat skin dry, then leave it slightly damp. DO NOT scrub it dry. Apply steroid cream to skin and rub in all the way, wait 15 min, then apply a daily moisturizer (Vaseline, Eucerin, Aveeno). Continue daily moisturizer every day of the year (even after flare is resolved) - If you have eczema on hands or dry hands, recommend wearing any type of gloves overnight (cloth, fabric, or even nitrile/latex) to improve effect of topical moisturizer  If develops redness, honey colored  crust oozing, drainage of pus, bleeding, or redness / swelling, pain, please return for re-evaluation, may have become infected after scratching.  You will be due for FASTING BLOOD WORK (no food or drink after midnight before, only water or coffee without cream/sugar on the morning of)  - Please go ahead and schedule a "Lab Only" visit in the morning at the clinic for lab draw within 7 days of todays visit from Annual Physical  For Lab Results, once available within 2-3 days of blood draw, you can can log in to MyChart online to view your results and a brief explanation. Also, we can discuss  results at next follow-up visit.  Please schedule a Follow-up Appointment to: Return in about 1 year (around 07/17/2017) for Annual Physical.  If you have any other questions or concerns, please feel free to call the clinic or send a message through [place]. You may also schedule an earlier appointment if necessary.  Additionally, you may be receiving a survey about your experience at our clinic within a few days to 1 week by e-mail or mail. We value your feedback.  Nobie Putnam, DO [facility]

## 2016-07-17 NOTE — Assessment & Plan Note (Signed)
Due for routine colon cancer screening. Never had colonoscopy (not interested), no family history colon cancer. - Discussion today about recommendations for either Colonoscopy or Cologuard screening, benefits and risks of screening, interested in Cologuard, understands that if positive then recommendation is for diagnostic colonoscopy to follow-up. - Currently has Cologuard test at home, ordered from previous provider, he will proceed with steps to complete this

## 2016-07-17 NOTE — Progress Notes (Signed)
Subjective:    Patient ID: [name], male    DOB: 07/25/1960, 56 y.o.   MRN: [id number]  [name] is a 56 y.o. male presenting on 07/17/2016 for Annual Exam  History provided by patient in [place] ([place]), and interpretation by [employer] [phone], also interpretation provided by daughter, [name].  HPI   CHRONIC DM, Type 2: Reports no concerns. Prior A1c has improved. CBGs: Avg 100s, but recently AM 120s, Low none < 100, High none >160. Checks CBGs x 1 daily Meds: Metformin 500mg  BID Reports good compliance. Tolerating well w/o side-effects Currently not on ACEi/ARB, was on Lisinopril 5mg  daily in past, taken off 2 years ago due to lower BP. Taking Statin and on anti-platelet Lifestyle: - Diet (No particular dietary changes, has eaten some sweets and fruit juices went on vacation recently) - Exercise (Stays active working at [employer], but does not do regular exercise) - Followed by [employer],  Denies hypoglycemia, polyuria, visual changes, numbness or tingling.  CHRONIC HTN:  Reports he checks BP at home, 120-130s/80s Current Meds - Carvedilol 3.125mg  HALF tab BID Reports good compliance, took meds today. Tolerating well, w/o complaints.  HYPERLIPIDEMIA: - Reports no concerns. Last lipid panel 09/2015 mostly controlled but mild elevated TG. Not fasting today, will return - Currently taking Pravastatin 40mg  daily, tolerating well without side effects or myalgias  History of CVA, without residual deficit - Reported prior history of CVA in 2013 approx, has done well since without any significant residual deficits. - He takes Aggrenox BID without complication  History of heat rash and eczema on lower extremity: - Reports in summer often has more irritation rash of lower extremity, sometimes with sweating and moisture, will get dry irritated patches of skin. - Limited improvement with OTC Hydrocortisone in past, improved more with rx  topical steroid by previous doctor  Health Maintenance: - Colon CA Screening: No prior test performed, no known personal or family history of colon cancer or concerning symptoms. Previously was referred to General Surgery for colonoscopy and do not see that this was ever performed. Also ordered Cologuard in past, and has at home, ready to use but was waiting until office visit to discuss first, now ready to collect - Prostate CA Screening: No known personal or family history of prostate CA. Prior PSA reported normal, due for repeat PSA, declines DRE. Asymptomatic prostate - Due for routine Hepatitis C and HIV screening: agree to Hep C test today, declines HIV - Due for Pneumonia vaccine (Pneumovax-23) in setting of DM2  Past Medical History:  Diagnosis Date  . Cyst of thyroid 2013   right  . Diabetes mellitus without complication ([place]) 5176  . Hyperlipidemia   . Hypertension 2010  . Kidney stones   . Stroke [facility]) 2013   Past Surgical History:  Procedure Laterality Date  . APPENDECTOMY  2003  . FINE NEEDLE ASPIRATION Right 09-28-13   thyroid nodule/BENIGN FOLLICULAR CELLS, COLLOID   Social History   Social History  . Marital status: Married    Spouse name: N/A  . Number of children: N/A  . Years of education: N/A   Occupational History  . Not on file.   Social History Main Topics  . Smoking status: Never Smoker  . Smokeless tobacco: Never Used  . Alcohol use No  . Drug use: No  . Sexual activity: Not on file   Other Topics Concern  . Not on file   Social History Narrative  .  No narrative on file   Family History  Problem Relation Age of Onset  . Family history unknown: Yes   Current Outpatient Prescriptions on File Prior to Visit  Medication Sig  . AGGRENOX 25-200 MG 12hr capsule TAKE 1 CAPSULE 2 TIMES DAILY   No current facility-administered medications on file prior to visit.     Review of Systems  Constitutional: Negative for activity change, appetite  change, chills, diaphoresis, fatigue and fever.  HENT: Negative for congestion, hearing loss and sinus pressure.   Eyes: Negative for visual disturbance.  Respiratory: Negative for apnea, cough, choking, chest tightness, shortness of breath and wheezing.   Cardiovascular: Negative for chest pain, palpitations and leg swelling.  Gastrointestinal: Negative for abdominal pain, constipation, diarrhea, nausea and vomiting.  Endocrine: Negative for cold intolerance and polyuria.  Genitourinary: Negative for decreased urine volume, difficulty urinating, dysuria, frequency, hematuria and testicular pain.  Musculoskeletal: Negative for arthralgias, back pain and neck pain.  Skin: Positive for rash.  Allergic/Immunologic: Negative for environmental allergies.  Neurological: Negative for dizziness, weakness, light-headedness, numbness and headaches.  Hematological: Negative for adenopathy.  Psychiatric/Behavioral: Negative for behavioral problems, dysphoric mood and sleep disturbance. The patient is not nervous/anxious.    Per HPI unless specifically indicated above    Objective:    BP 125/83   Pulse 68   Temp (!) 97.5 F (36.4 C) (Oral)   Resp 16   Ht 5\' 10"  (1.778 m)   Wt 143 lb (64.9 kg)   BMI 20.52 kg/m   Wt Readings from Last 3 Encounters:  07/17/16 143 lb (64.9 kg)  02/05/16 143 lb (64.9 kg)  09/06/15 143 lb (64.9 kg)    Physical Exam  Constitutional: He is oriented to person, place, and time. He appears well-developed and well-nourished. No distress.  Well-appearing, comfortable, cooperative  HENT:  Head: Normocephalic and atraumatic.  Mouth/Throat: Oropharynx is clear and moist.  Frontal / maxillary sinuses non-tender. Nares patent without purulence or edema. Bilateral TMs clear without erythema, effusion or bulging. Oropharynx clear without erythema, exudates, edema or asymmetry.  Eyes: Pupils are equal, round, and reactive to light. Conjunctivae and EOM are normal. Right eye  exhibits no discharge. Left eye exhibits no discharge.  Neck: Normal range of motion. Neck supple. No thyromegaly present.  No carotid bruits  Cardiovascular: Normal rate, regular rhythm, normal heart sounds and intact distal pulses.   No murmur heard. Pulmonary/Chest: Effort normal and breath sounds normal. No respiratory distress. He has no wheezes. He has no rales.  Abdominal: Soft. Bowel sounds are normal. He exhibits no distension and no mass. There is no tenderness.  Musculoskeletal: Normal range of motion. He exhibits edema (trace non pitting edema bilateral lower extremity and ankle). He exhibits no tenderness.  Upper / Lower Extremities: - Normal muscle tone, strength bilateral upper extremities 5/5, lower extremities 5/5  Lymphadenopathy:    He has no cervical adenopathy.  Neurological: He is alert and oriented to person, place, and time.  Distal sensation intact to light touch all extremities  Skin: Skin is warm and dry. Rash (mild scattered excoriated rash on bilateral lower extremity evidence of scratching, healing, slightly dry erythematous skin.) noted. He is not diaphoretic. No erythema.  Bilateral lower extremity with varicose veins and spider veins, no ulceration  Psychiatric: He has a normal mood and affect. His behavior is normal.  Well groomed, good eye contact, normal speech and thoughts  Nursing note and vitals reviewed.    Diabetic Foot Exam - Simple  Simple Foot Form Diabetic Foot exam was performed with the following findings:  Yes 07/17/2016 10:44 AM  Visual Inspection No deformities, no ulcerations, no other skin breakdown bilaterally:  Yes Sensation Testing Intact to touch and monofilament testing bilaterally:  Yes Pulse Check Posterior Tibialis and Dorsalis pulse intact bilaterally:  Yes Comments     Recent Labs  09/06/15 0959 07/17/16 1333  HGBA1C 6.6% 6.3*    Results for orders placed or performed in visit on 07/17/16  POCT UA - Microalbumin    Result Value Ref Range   Microalbumin Ur, POC 0 mg/L   Creatinine, POC  mg/dL   Albumin/Creatinine Ratio, Urine, POC    POCT HgB A1C  Result Value Ref Range   Hemoglobin A1C 6.3 (A) 5.7      Assessment & Plan:   Problem List Items Addressed This Visit    Type 2 diabetes mellitus without complication, without long-term current use of insulin (HCC)    Well-controlled DM with A1c 6.3 (improved from 6.6, 7) No known complications or hypoglycemia. History of CVA  Plan:  1. Continue current therapy - Metformin 500mg  BID 2. Encourage improved lifestyle - low carb, low sugar diet, reduce portion size, recommend to start regular exercise 3. Check CBG, bring log to next visit for review 4. Continue Aggrenox (anti-platelet), Statin 5. Check urine micro today negative. New start ACEi lisinopril 2.5mg  daily for renal protection 6. DM Foot exam done today / Advised to schedule DM ophtho exam, send record 7. Follow-up 1 year, return 1 week for fasting labs, if worsening CBGs at home can return in 6 months for A1c      Relevant Medications   metFORMIN (GLUCOPHAGE) 500 MG tablet   pravastatin (PRAVACHOL) 40 MG tablet   lisinopril (PRINIVIL,ZESTRIL) 2.5 MG tablet   Other Relevant Orders   POCT UA - Microalbumin (Completed)   POCT HgB A1C (Completed)   COMPLETE METABOLIC PANEL WITH GFR   Hemoglobin A1c   Screening for prostate cancer    Low to Avg risk patient, with prior reported negative screening - Last PSA normal few years ago reported - Clinically asymptomatic  Plan: 1. Reviewed prostate cancer screening guidelines and risks including potential prostate biopsy if abnormal PSA, proceed with yearly PSA for now, and anticipate DRE as needed especially if abnormal PSA or new symptoms      Relevant Orders   PSA, Total with Reflex to PSA, Free   Screening for colon cancer    Due for routine colon cancer screening. Never had colonoscopy (not interested), no family history colon  cancer. - Discussion today about recommendations for either Colonoscopy or Cologuard screening, benefits and risks of screening, interested in Cologuard, understands that if positive then recommendation is for diagnostic colonoscopy to follow-up. - Currently has Cologuard test at home, ordered from previous provider, he will proceed with steps to complete this      Nontoxic uninodular goiter    Check TSH, clinically without problem or symptoms      Relevant Medications   carvedilol (COREG) 3.125 MG tablet   Other Relevant Orders   TSH   Hyperlipidemia associated with type 2 diabetes mellitus ([place])    Mostly controlled cholesterol on statin and lifestyle Last lipid panel 07/2015 with normal results, except mild elevated TG Elevated ASCVD risk with prior CVA  Plan: 1. Continue current meds - Pravastatin 40mg  daily 2. Continue Aggrenox secondary ASCVD risk reduction 3. Encourage improved lifestyle - low carb/cholesterol, reduce portion size, continue improving regular  exercise 4. Follow-up 1 yr, and fasting labs in 1 month      Relevant Medications   carvedilol (COREG) 3.125 MG tablet   metFORMIN (GLUCOPHAGE) 500 MG tablet   pravastatin (PRAVACHOL) 40 MG tablet   lisinopril (PRINIVIL,ZESTRIL) 2.5 MG tablet   Other Relevant Orders   Lipid panel   History of CVA (cerebrovascular accident)    Stable clinically without residual deficits from prior CVA History CVA 2013 On Aggrenox, continues regularly for anti-platelet Continue Pravastatin for ASCVD risk reduction No longer followed by Neurology      Essential hypertension    Well-controlled HTN - Home BP readings normal  History of CVA   Plan:  1. Continue current BP regimen Carvedilol 3.125mg  HALF tab BID (dose =1.57) 2. Will add low dose ACEi Lisinopril 2.5mg  daily for renal protection in DM and may help stabilize Bp as well, previously DC'd Lisinopril 5mg  due to low BP years ago 3. Encourage improved lifestyle - low sodium  diet, regular exercise 3. Continue monitor BP outside office, bring readings to next visit, if persistently >140/90 or new symptoms notify office sooner 4. Follow-up 1 year for annual - return 1 week for fasting labs      Relevant Medications   carvedilol (COREG) 3.125 MG tablet   pravastatin (PRAVACHOL) 40 MG tablet   lisinopril (PRINIVIL,ZESTRIL) 2.5 MG tablet   Other Relevant Orders   COMPLETE METABOLIC PANEL WITH GFR   Anemia    Prior mild low Hgb, overall stable No signs or symptoms of bleeding or concern on anti-platelet Check CBC      Relevant Orders   CBC with Differential/Platelet    Other Visit Diagnoses    Annual physical exam    -  Primary   Other eczema       Trial on topical steroid cream, triamcinolone, routine skin care reviewed, RICE therapy for swelling   Relevant Medications   triamcinolone cream (KENALOG) 0.5 %   Need for pneumococcal vaccination       pneumovax-23 today, then due for Prevnar13 at age 55 then pneumovax-23 booster again at age 80 1 year later   Relevant Orders   Pneumococcal polysaccharide vaccine 23-valent greater than or equal to 2yo subcutaneous/IM (Completed)   Need for hepatitis C screening test       Relevant Orders   Hepatitis C antibody      Meds ordered this encounter  Medications  . carvedilol (COREG) 3.125 MG tablet    Sig: TAKE 1/2 TABLET ORALLY TWICE A DAY.    Dispense:  90 tablet    Refill:  3  . metFORMIN (GLUCOPHAGE) 500 MG tablet    Sig: Take 1 tablet (500 mg total) by mouth 2 (two) times daily with a meal.    Dispense:  180 tablet    Refill:  3  . pravastatin (PRAVACHOL) 40 MG tablet    Sig: Take 1 tablet (40 mg total) by mouth daily.    Dispense:  90 tablet    Refill:  3  . lisinopril (PRINIVIL,ZESTRIL) 2.5 MG tablet    Sig: Take 1 tablet (2.5 mg total) by mouth daily.    Dispense:  90 tablet    Refill:  3  . triamcinolone cream (KENALOG) 0.5 %    Sig: Apply 1 application topically 2 (two) times daily. To  affected areas, for up to 2 weeks.    Dispense:  30 g    Refill:  2    Follow up plan: Return in  about 1 year (around 07/17/2017) for Annual Physical.  Nobie Putnam, DO [facility] 07/17/2016, 1:39 PM

## 2016-07-17 NOTE — Assessment & Plan Note (Signed)
Well-controlled HTN - Home BP readings normal  History of CVA   Plan:  1. Continue current BP regimen Carvedilol 3.125mg  HALF tab BID (dose =1.57) 2. Will add low dose ACEi Lisinopril 2.5mg  daily for renal protection in DM and may help stabilize Bp as well, previously DC'd Lisinopril 5mg  due to low BP years ago 3. Encourage improved lifestyle - low sodium diet, regular exercise 3. Continue monitor BP outside office, bring readings to next visit, if persistently >140/90 or new symptoms notify office sooner 4. Follow-up 1 year for annual - return 1 week for fasting labs

## 2016-07-17 NOTE — Assessment & Plan Note (Signed)
Prior mild low Hgb, overall stable No signs or symptoms of bleeding or concern on anti-platelet Check CBC

## 2016-07-17 NOTE — Assessment & Plan Note (Addendum)
Well-controlled DM with A1c 6.3 (improved from 6.6, 7) No known complications or hypoglycemia. History of CVA  Plan:  1. Continue current therapy - Metformin 500mg  BID 2. Encourage improved lifestyle - low carb, low sugar diet, reduce portion size, recommend to start regular exercise 3. Check CBG, bring log to next visit for review 4. Continue Aggrenox (anti-platelet), Statin 5. Check urine micro today negative. New start ACEi lisinopril 2.5mg  daily for renal protection 6. DM Foot exam done today / Advised to schedule DM ophtho exam, send record 7. Follow-up 1 year, return 1 week for fasting labs, if worsening CBGs at home can return in 6 months for A1c

## 2016-07-17 NOTE — Assessment & Plan Note (Addendum)
Stable clinically without residual deficits from prior CVA History CVA 2013 On Aggrenox, continues regularly for anti-platelet Continue Pravastatin for ASCVD risk reduction No longer followed by Neurology

## 2016-07-17 NOTE — Assessment & Plan Note (Signed)
Low to Avg risk patient, with prior reported negative screening - Last PSA normal few years ago reported - Clinically asymptomatic  Plan: 1. Reviewed prostate cancer screening guidelines and risks including potential prostate biopsy if abnormal PSA, proceed with yearly PSA for now, and anticipate DRE as needed especially if abnormal PSA or new symptoms

## 2016-07-17 NOTE — Assessment & Plan Note (Signed)
Mostly controlled cholesterol on statin and lifestyle Last lipid panel 07/2015 with normal results, except mild elevated TG Elevated ASCVD risk with prior CVA  Plan: 1. Continue current meds - Pravastatin 40mg  daily 2. Continue Aggrenox secondary ASCVD risk reduction 3. Encourage improved lifestyle - low carb/cholesterol, reduce portion size, continue improving regular exercise 4. Follow-up 1 yr, and fasting labs in 1 month

## 2016-07-22 ENCOUNTER — Other Ambulatory Visit: Payer: Self-pay | Admitting: Family Medicine

## 2016-07-22 ENCOUNTER — Other Ambulatory Visit: Payer: BLUE CROSS/BLUE SHIELD

## 2016-07-23 ENCOUNTER — Other Ambulatory Visit: Payer: Self-pay | Admitting: Family Medicine

## 2016-07-23 ENCOUNTER — Other Ambulatory Visit: Payer: Self-pay

## 2016-07-23 ENCOUNTER — Telehealth: Payer: Self-pay | Admitting: Family Medicine

## 2016-07-23 DIAGNOSIS — Z125 Encounter for screening for malignant neoplasm of prostate: Secondary | ICD-10-CM

## 2016-07-23 DIAGNOSIS — E1169 Type 2 diabetes mellitus with other specified complication: Secondary | ICD-10-CM

## 2016-07-23 DIAGNOSIS — E785 Hyperlipidemia, unspecified: Secondary | ICD-10-CM

## 2016-07-23 DIAGNOSIS — E041 Nontoxic single thyroid nodule: Secondary | ICD-10-CM

## 2016-07-23 DIAGNOSIS — D509 Iron deficiency anemia, unspecified: Secondary | ICD-10-CM

## 2016-07-23 DIAGNOSIS — Z1159 Encounter for screening for other viral diseases: Secondary | ICD-10-CM

## 2016-07-23 DIAGNOSIS — I10 Essential (primary) hypertension: Secondary | ICD-10-CM

## 2016-07-23 DIAGNOSIS — E119 Type 2 diabetes mellitus without complications: Secondary | ICD-10-CM

## 2016-07-23 LAB — HEMOGLOBIN A1C
HEMOGLOBIN A1C: 6.1 % — AB (ref <5.7)
MEAN PLASMA GLUCOSE: 128 mg/dL

## 2016-07-23 NOTE — Telephone Encounter (Signed)
Last seen on 07/17/16 for Annual Physical, he was due for yearly fasting labs, he had POC A1c in office, and then future lab orders placed by me for expected 07/22/16. However patient arrived on 7/17 only had A1c lab drawn (suspected from prior PCP Dr Luan Pulling order). Now resulted 6.1. Discussed with [facility] Cindy, and she will plan to try to get duplicate N0U credited back to patient, and notify him to return for fasting lab only to return for repeat blood work to draw the orders from the physical (CMET, Lipid, CBC, TSH, Hep C, PSA).  Nobie Putnam, [facility] 07/23/2016, 11:47 AM

## 2016-07-24 LAB — COLOGUARD: Cologuard: POSITIVE

## 2016-07-25 LAB — CBC WITH DIFFERENTIAL/PLATELET
BASOS ABS: 61 {cells}/uL (ref 0–200)
Basophils Relative: 1 %
EOS ABS: 183 {cells}/uL (ref 15–500)
Eosinophils Relative: 3 %
HEMATOCRIT: 36.5 % — AB (ref 38.5–50.0)
HEMOGLOBIN: 11.7 g/dL — AB (ref 13.2–17.1)
LYMPHS ABS: 2013 {cells}/uL (ref 850–3900)
Lymphocytes Relative: 33 %
MCH: 30.8 pg (ref 27.0–33.0)
MCHC: 32.1 g/dL (ref 32.0–36.0)
MCV: 96.1 fL (ref 80.0–100.0)
MPV: 10.2 fL (ref 7.5–12.5)
Monocytes Absolute: 427 cells/uL (ref 200–950)
Monocytes Relative: 7 %
NEUTROS ABS: 3416 {cells}/uL (ref 1500–7800)
NEUTROS PCT: 56 %
Platelets: 240 10*3/uL (ref 140–400)
RBC: 3.8 MIL/uL — ABNORMAL LOW (ref 4.20–5.80)
RDW: 13.5 % (ref 11.0–15.0)
WBC: 6.1 10*3/uL (ref 3.8–10.8)

## 2016-07-25 LAB — COMPLETE METABOLIC PANEL WITH GFR
ALBUMIN: 3.7 g/dL (ref 3.6–5.1)
ALK PHOS: 50 U/L (ref 40–115)
ALT: 16 U/L (ref 9–46)
AST: 17 U/L (ref 10–35)
BUN: 17 mg/dL (ref 7–25)
CO2: 24 mmol/L (ref 20–31)
Calcium: 8.6 mg/dL (ref 8.6–10.3)
Chloride: 107 mmol/L (ref 98–110)
Creat: 1.15 mg/dL (ref 0.70–1.33)
GFR, Est African American: 81 mL/min (ref >=60)
GFR, Est Non African American: 70 mL/min (ref >=60)
GLUCOSE: 100 mg/dL — AB (ref 65–99)
POTASSIUM: 4.1 mmol/L (ref 3.5–5.3)
SODIUM: 140 mmol/L (ref 135–146)
Total Bilirubin: 0.5 mg/dL (ref 0.2–1.2)
Total Protein: 6.5 g/dL (ref 6.1–8.1)

## 2016-07-25 LAB — LIPID PANEL
CHOLESTEROL: 171 mg/dL (ref <200)
HDL: 34 mg/dL — ABNORMAL LOW (ref >40)
LDL Cholesterol: 77 mg/dL (ref <100)
TRIGLYCERIDES: 302 mg/dL — AB (ref <150)
Total CHOL/HDL Ratio: 5 Ratio — ABNORMAL HIGH (ref <5.0)
VLDL: 60 mg/dL — AB (ref <30)

## 2016-07-26 LAB — HEPATITIS C ANTIBODY: HCV Ab: NEGATIVE

## 2016-07-26 LAB — TSH: TSH: 2.03 m[IU]/L (ref 0.40–4.50)

## 2016-07-28 ENCOUNTER — Other Ambulatory Visit: Payer: Self-pay | Admitting: Family Medicine

## 2016-07-28 ENCOUNTER — Encounter: Payer: Self-pay | Admitting: Family Medicine

## 2016-07-28 DIAGNOSIS — D509 Iron deficiency anemia, unspecified: Secondary | ICD-10-CM

## 2016-07-28 DIAGNOSIS — E1169 Type 2 diabetes mellitus with other specified complication: Secondary | ICD-10-CM

## 2016-07-28 DIAGNOSIS — E119 Type 2 diabetes mellitus without complications: Secondary | ICD-10-CM

## 2016-07-28 DIAGNOSIS — Z0000 Encounter for general adult medical examination without abnormal findings: Secondary | ICD-10-CM

## 2016-07-28 DIAGNOSIS — R195 Other fecal abnormalities: Secondary | ICD-10-CM

## 2016-07-28 DIAGNOSIS — I10 Essential (primary) hypertension: Secondary | ICD-10-CM

## 2016-07-28 DIAGNOSIS — E785 Hyperlipidemia, unspecified: Secondary | ICD-10-CM

## 2016-07-28 DIAGNOSIS — Z125 Encounter for screening for malignant neoplasm of prostate: Secondary | ICD-10-CM

## 2016-07-28 LAB — PSA, TOTAL WITH REFLEX TO PSA, FREE: PSA, TOTAL: 0.6 ng/mL (ref <=4.0)

## 2016-09-24 ENCOUNTER — Ambulatory Visit: Payer: Self-pay | Admitting: Gastroenterology

## 2016-09-25 ENCOUNTER — Ambulatory Visit: Payer: Self-pay | Admitting: Gastroenterology

## 2016-10-30 ENCOUNTER — Telehealth: Payer: Self-pay | Admitting: Family Medicine

## 2016-10-30 DIAGNOSIS — Z8673 Personal history of transient ischemic attack (TIA), and cerebral infarction without residual deficits: Secondary | ICD-10-CM

## 2016-10-30 NOTE — Telephone Encounter (Signed)
Pt's daughter [name] said his door to door program will be ending soon and asked if there were any discount cards for aggrenox.  Her call back number is [phone]

## 2016-10-31 NOTE — Telephone Encounter (Signed)
Last seen 07/17/16 for annual physical, reviewed his history with prior CVA. He has been on Aggrenox for few years now. He has 6 month supply left, and stated that their previous program for cost savings door to door will no longer be available to them.  I looked into alternative options, and one possibility would be Plavix (Clopidogrel) 75mg  daily for secondary stroke prevention and risk reduction, which has been studied in the PRoFESS trial for comparison with Aggrenox, with nearly equivalent secondary prevention.  I called Victor Langenbach (Patient's daughter) back and reviewed this with her, she is familiar with Plavix from working as Cardiac NP, and would consider this. Ultimately we agreed upon a second opinion from Neurology. She states that he has never "seen" Neurology in outpatient consult and would agree with my recommendation to discuss with them to see if any other alternative options, maybe discount or cost saving for Aggrenox, generics, or the possible switch to Plavix.  Preference for local Neurology, referral will be sent to [facility] in [place], and she will plan to make arrangements to be seen in early 2019. I think this is reasonable to review his prior history, and see which med is appropriate for him and if any other testing is indicated.  Nobie Putnam, DO [facility] 10/31/2016, 5:05 PM

## 2016-10-31 NOTE — Telephone Encounter (Signed)
Called patient's daughter looking for discount card, the program " door to door" has ended and medication is expensive or if Dr. Raliegh Ip find any alternate med's ?

## 2016-11-04 ENCOUNTER — Encounter: Payer: Self-pay | Admitting: Gastroenterology

## 2016-11-04 ENCOUNTER — Other Ambulatory Visit: Payer: Self-pay

## 2016-11-04 ENCOUNTER — Ambulatory Visit (INDEPENDENT_AMBULATORY_CARE_PROVIDER_SITE_OTHER): Payer: BLUE CROSS/BLUE SHIELD | Admitting: Gastroenterology

## 2016-11-04 VITALS — BP 138/87 | HR 80 | Temp 98.1°F | Ht 70.0 in | Wt 149.8 lb

## 2016-11-04 DIAGNOSIS — R195 Other fecal abnormalities: Secondary | ICD-10-CM

## 2016-11-04 NOTE — Progress Notes (Signed)
Gastroenterology Consultation  Referring Provider:     Nobie Hoffman * Primary Care Physician:  Johnny Hauser, DO Primary Gastroenterologist:  Dr. Allen Hoffman     Reason for Consultation:     Positive Cologuard        HPI:   [name] is a 56 y.o. y/o male referred for consultation & management of positive cologuard by Dr. Parks Hoffman, Johnny Doughty, DO.  This patient comes in today after being found to have a positive stool test.  The patient has never had a colonoscopy and was hesitant to undergo a colonoscopy so he was given a cologuard test.  The patient was informed that that test was positive and he should follow-up with a gastroenterologist for a colonoscopy.  Past Medical History:  Diagnosis Date  . Cyst of thyroid 2013   right  . Diabetes mellitus without complication ([place]) 0932  . Hyperlipidemia   . Hypertension 2010  . Kidney stones   . Stroke [facility]) 2013    Past Surgical History:  Procedure Laterality Date  . APPENDECTOMY  2003  . FINE NEEDLE ASPIRATION Right 09-28-13   thyroid nodule/BENIGN FOLLICULAR CELLS, COLLOID    Prior to Admission medications   Medication Sig Start Date End Date Taking? Authorizing Provider  AGGRENOX 25-200 MG 12hr capsule TAKE 1 CAPSULE 2 TIMES DAILY 03/28/16  Yes [name]., MD  carvedilol (COREG) 3.125 MG tablet TAKE 1/2 TABLET ORALLY TWICE A DAY. 07/17/16  Yes [name], Johnny Doughty, DO  metFORMIN (GLUCOPHAGE) 500 MG tablet Take 1 tablet (500 mg total) by mouth 2 (two) times daily with a meal. 07/17/16  Yes [name], Johnny Doughty, DO  pravastatin (PRAVACHOL) 40 MG tablet Take 1 tablet (40 mg total) by mouth daily. 07/17/16  Yes [name], Johnny Doughty, DO  triamcinolone cream (KENALOG) 0.5 % Apply 1 application topically 2 (two) times daily. To affected areas, for up to 2 weeks. 07/17/16  Yes [name], Johnny Doughty, DO    Family History  Problem Relation Age of Onset  . Family history unknown: Yes      Social History  Substance Use Topics  . Smoking status: Never Smoker  . Smokeless tobacco: Never Used  . Alcohol use No    Allergies as of 11/04/2016 - Review Complete 11/04/2016  Allergen Reaction Noted  . Toradol [ketorolac tromethamine] Rash 07/13/2012    Review of Systems:    All systems reviewed and negative except where noted in HPI.   Physical Exam:  BP 138/87   Pulse 80   Temp 98.1 F (36.7 C) (Oral)   Ht 5\' 10"  (1.778 m)   Wt 149 lb 12.8 oz (67.9 kg)   BMI 21.49 kg/m  No LMP for male patient. Psych:  Alert and cooperative. Normal mood and affect. General:   Alert,  Well-developed, well-nourished, pleasant and cooperative in NAD Head:  Normocephalic and atraumatic. Eyes:  Sclera clear, no icterus.   Conjunctiva pink. Ears:  Normal auditory acuity. Nose:  No deformity, discharge, or lesions. Mouth:  No deformity or lesions,oropharynx pink & moist. Neck:  Supple; no masses or thyromegaly. Lungs:  Respirations even and unlabored.  Clear throughout to auscultation.   No wheezes, crackles, or rhonchi. No acute distress. Heart:  Regular rate and rhythm; no murmurs, clicks, rubs, or gallops. Abdomen:  Normal bowel sounds.  No bruits.  Soft, non-tender and non-distended without masses, hepatosplenomegaly or hernias noted.  No guarding or rebound tenderness.  Negative Carnett sign.   Rectal:  Deferred.  Msk:  Symmetrical without gross deformities.  Good, equal movement & strength bilaterally. Pulses:  Normal pulses noted. Extremities:  No clubbing or edema.  No cyanosis. Neurologic:  Alert and oriented x3;  grossly normal neurologically. Skin:  Intact without significant lesions or rashes.  No jaundice. Lymph Nodes:  No significant cervical adenopathy. Psych:  Alert and cooperative. Normal mood and affect.  Imaging Studies: No results found.  Assessment and Plan:   [name] is a 56 y.o. y/o male with a positive stool Cologuard.  The patient has been explained  the implications of having a positive test and that a colonoscopy will need to be done to make sure there is not any obvious colon pathology as the cause of his positive test.  The patient will be set up for a colonoscopy.  I have discussed risks & benefits which include, but are not limited to, bleeding, infection, perforation & drug reaction.  The patient agrees with this plan & written consent will be obtained.     Johnny Lame, MD. [name]   Note: This dictation was prepared with Dragon dictation along with smaller phrase technology. Any transcriptional errors that result from this process are unintentional.

## 2016-11-06 ENCOUNTER — Other Ambulatory Visit: Payer: Self-pay

## 2016-11-06 DIAGNOSIS — R195 Other fecal abnormalities: Secondary | ICD-10-CM

## 2016-12-01 ENCOUNTER — Encounter: Payer: Self-pay | Admitting: *Deleted

## 2016-12-02 ENCOUNTER — Ambulatory Visit: Payer: BLUE CROSS/BLUE SHIELD | Admitting: Anesthesiology

## 2016-12-02 ENCOUNTER — Encounter
Admission: RE | Disposition: A | Discharge status: Home / Self Care | Payer: Self-pay | Source: Ambulatory Visit | Attending: Gastroenterology

## 2016-12-02 ENCOUNTER — Ambulatory Visit
Admission: RE | Admit: 2016-12-02 | Discharge: 2016-12-02 | Disposition: A | Discharge status: Home / Self Care | Payer: BLUE CROSS/BLUE SHIELD | Source: Ambulatory Visit | Attending: Gastroenterology | Admitting: Gastroenterology

## 2016-12-02 ENCOUNTER — Encounter: Payer: Self-pay | Admitting: *Deleted

## 2016-12-02 DIAGNOSIS — D124 Benign neoplasm of descending colon: Secondary | ICD-10-CM

## 2016-12-02 DIAGNOSIS — Z8673 Personal history of transient ischemic attack (TIA), and cerebral infarction without residual deficits: Secondary | ICD-10-CM | POA: Insufficient documentation

## 2016-12-02 DIAGNOSIS — K640 First degree hemorrhoids: Secondary | ICD-10-CM | POA: Diagnosis not present

## 2016-12-02 DIAGNOSIS — Z79899 Other long term (current) drug therapy: Secondary | ICD-10-CM | POA: Diagnosis not present

## 2016-12-02 DIAGNOSIS — E785 Hyperlipidemia, unspecified: Secondary | ICD-10-CM | POA: Insufficient documentation

## 2016-12-02 DIAGNOSIS — K635 Polyp of colon: Secondary | ICD-10-CM | POA: Diagnosis not present

## 2016-12-02 DIAGNOSIS — R195 Other fecal abnormalities: Secondary | ICD-10-CM | POA: Diagnosis not present

## 2016-12-02 DIAGNOSIS — I10 Essential (primary) hypertension: Secondary | ICD-10-CM | POA: Diagnosis not present

## 2016-12-02 DIAGNOSIS — Z7902 Long term (current) use of antithrombotics/antiplatelets: Secondary | ICD-10-CM | POA: Diagnosis not present

## 2016-12-02 DIAGNOSIS — E119 Type 2 diabetes mellitus without complications: Secondary | ICD-10-CM | POA: Insufficient documentation

## 2016-12-02 DIAGNOSIS — Z7984 Long term (current) use of oral hypoglycemic drugs: Secondary | ICD-10-CM | POA: Diagnosis not present

## 2016-12-02 HISTORY — PX: COLONOSCOPY WITH PROPOFOL: SHX5780

## 2016-12-02 LAB — GLUCOSE, CAPILLARY: Glucose-Capillary: 132 mg/dL — ABNORMAL HIGH (ref 65–99)

## 2016-12-02 SURGERY — COLONOSCOPY WITH PROPOFOL
Anesthesia: General

## 2016-12-02 MED ORDER — SODIUM CHLORIDE 0.9 % IV SOLN
INTRAVENOUS | Status: DC
  Administered 2016-12-02: 09:00:00 via INTRAVENOUS

## 2016-12-02 MED ORDER — LIDOCAINE HCL (PF) 2 % IJ SOLN
INTRAMUSCULAR | Status: AC
  Filled 2016-12-02: qty 10

## 2016-12-02 MED ORDER — PROPOFOL 500 MG/50ML IV EMUL
INTRAVENOUS | Status: AC
  Filled 2016-12-02: qty 50

## 2016-12-02 MED ORDER — ONDANSETRON HCL 4 MG/2ML IJ SOLN: 4.0000 mg | Freq: Once | INTRAMUSCULAR | Status: DC | PRN

## 2016-12-02 MED ORDER — PROPOFOL 10 MG/ML IV BOLUS
INTRAVENOUS | Status: DC | PRN
  Administered 2016-12-02: 280 mg via INTRAVENOUS

## 2016-12-02 MED ORDER — FENTANYL CITRATE (PF) 100 MCG/2ML IJ SOLN: 25.0000 ug | INTRAMUSCULAR | Status: DC | PRN

## 2016-12-02 MED ORDER — LIDOCAINE HCL (CARDIAC) 20 MG/ML IV SOLN
INTRAVENOUS | Status: DC | PRN
  Administered 2016-12-02: 50 mg via INTRAVENOUS

## 2016-12-02 NOTE — Anesthesia Postprocedure Evaluation (Signed)
Anesthesia Post Note  Patient: [name]  Procedure(s) Performed: COLONOSCOPY WITH PROPOFOL (N/A )  Patient location during evaluation: PACU Anesthesia Type: General Level of consciousness: awake and alert and oriented Pain management: pain level controlled Vital Signs Assessment: post-procedure vital signs reviewed and stable Respiratory status: spontaneous breathing Cardiovascular status: blood pressure returned to baseline Anesthetic complications: no     Last Vitals:  Vitals:   12/02/16 1002 12/02/16 1012  BP: 115/86 (!) 127/91  Pulse: 73 72  Resp: 11 12  Temp:    SpO2: 100% 99%    Last Pain:  Vitals:   12/02/16 0824  TempSrc: Tympanic                 Antrice Pal

## 2016-12-02 NOTE — H&P (Signed)
Johnny Lame, MD [facility] [address], [place], [place] [address] Phone:[phone] Fax : [phone]  Primary Care Physician:  Olin Hauser, DO Primary Gastroenterologist:  Dr. Allen Norris  Pre-Procedure History & Physical: HPI:  [name] is a 56 y.o. male is here for an colonoscopy.   Past Medical History:  Diagnosis Date  . Cyst of thyroid 2013   right  . Diabetes mellitus without complication ([place]) 8850  . Hyperlipidemia   . Hypertension 2010  . Kidney stones   . Stroke [facility]) 2013    Past Surgical History:  Procedure Laterality Date  . APPENDECTOMY  2003  . FINE NEEDLE ASPIRATION Right 09-28-13   thyroid nodule/BENIGN FOLLICULAR CELLS, COLLOID    Prior to Admission medications   Medication Sig Start Date End Date Taking? Authorizing Provider  AGGRENOX 25-200 MG 12hr capsule TAKE 1 CAPSULE 2 TIMES DAILY 03/28/16  Yes Arlis Porta., MD  carvedilol (COREG) 3.125 MG tablet TAKE 1/2 TABLET ORALLY TWICE A DAY. 07/17/16  Yes Karamalegos, Devonne Doughty, DO  lisinopril (PRINIVIL,ZESTRIL) 2.5 MG tablet Take 2.5 mg by mouth daily.   Yes [provider]  metFORMIN (GLUCOPHAGE) 500 MG tablet Take 1 tablet (500 mg total) by mouth 2 (two) times daily with a meal. 07/17/16  Yes Karamalegos, Devonne Doughty, DO  pravastatin (PRAVACHOL) 40 MG tablet Take 1 tablet (40 mg total) by mouth daily. 07/17/16  Yes Karamalegos, Devonne Doughty, DO  triamcinolone cream (KENALOG) 0.5 % Apply 1 application topically 2 (two) times daily. To affected areas, for up to 2 weeks. 07/17/16   Olin Hauser, DO    Allergies as of 11/06/2016 - Review Complete 11/04/2016  Allergen Reaction Noted  . Toradol [ketorolac tromethamine] Rash 07/13/2012    Family History  Family history unknown: Yes    Social History   Socioeconomic History  . Marital status: Married    Spouse name: Not on file  . Number of children: Not on file  . Years of education: Not on file  . Highest  education level: Not on file  Social Needs  . Financial resource strain: Not on file  . Food insecurity - worry: Not on file  . Food insecurity - inability: Not on file  . Transportation needs - medical: Not on file  . Transportation needs - non-medical: Not on file  Occupational History  . Not on file  Tobacco Use  . Smoking status: Never Smoker  . Smokeless tobacco: Never Used  Substance and Sexual Activity  . Alcohol use: No  . Drug use: No  . Sexual activity: Not on file  Other Topics Concern  . Not on file  Social History Narrative  . Not on file    Review of Systems: See HPI, otherwise negative ROS  Physical Exam: BP 127/76   Pulse 86   Temp (!) 96.5 F (35.8 C) (Tympanic)   Resp 20   Ht 5\' 8"  (1.727 m)   Wt 145 lb (65.8 kg)   SpO2 100%   BMI 22.05 kg/m  General:   Alert,  pleasant and cooperative in NAD Head:  Normocephalic and atraumatic. Neck:  Supple; no masses or thyromegaly. Lungs:  Clear throughout to auscultation.    Heart:  Regular rate and rhythm. Abdomen:  Soft, nontender and nondistended. Normal bowel sounds, without guarding, and without rebound.   Neurologic:  Alert and  oriented x4;  grossly normal neurologically.  Impression/Plan: [name] is here for an colonoscopy to be  performed for positive cologuard  Risks, benefits, limitations, and alternatives regarding  gastroscopy have been reviewed with the patient.  Questions have been answered.  All parties agreeable.   Johnny Lame, MD  12/02/2016, 8:59 AM

## 2016-12-02 NOTE — Anesthesia Post-op Follow-up Note (Signed)
Anesthesia QCDR form completed.        

## 2016-12-02 NOTE — Op Note (Signed)
[facility] Gastroenterology Patient Name: [name] Procedure Date: 12/02/2016 9:09 AM MRN: [id number] Account #: [id number] Date of Birth: [birth year] Admit Type: Outpatient Age: 56 Room: [facility] ENDO ROOM 4 Gender: Male Note Status: Finalized Procedure:            Colonoscopy Indications:          Positive Cologuard test Providers:            Lucilla Lame MD, MD Referring MD:         Olin Hauser (Referring MD) Medicines:            Propofol per Anesthesia Complications:        No immediate complications. Procedure:            Pre-Anesthesia Assessment:                       - Prior to the procedure, a History and Physical was                        performed, and patient medications and allergies were                        reviewed. The patient's tolerance of previous                        anesthesia was also reviewed. The risks and benefits of                        the procedure and the sedation options and risks were                        discussed with the patient. All questions were                        answered, and informed consent was obtained. Prior                        Anticoagulants: The patient has taken Trental                        (pentoxifylline), last dose was 1 day prior to                        procedure. ASA Grade Assessment: II - A patient with                        mild systemic disease. After reviewing the risks and                        benefits, the patient was deemed in satisfactory                        condition to undergo the procedure.                       After obtaining informed consent, the colonoscope was                        passed under direct vision. Throughout the procedure,  the patient's blood pressure, pulse, and oxygen                        saturations were monitored continuously. The                        Colonoscope was introduced through the anus and      advanced to the the cecum, identified by appendiceal                        orifice and ileocecal valve. The colonoscopy was                        performed without difficulty. The patient tolerated the                        procedure well. The quality of the bowel preparation                        was excellent. Findings:      The perianal and digital rectal examinations were normal.      A 2 mm polyp was found in the descending colon. The polyp was sessile.       The polyp was removed with a cold biopsy forceps. Resection and       retrieval were complete. To prevent bleeding post-intervention, one       hemostatic clip was successfully placed (MR conditional). There was no       bleeding at the end of the procedure.      Non-bleeding internal hemorrhoids were found during retroflexion. The       hemorrhoids were Grade I (internal hemorrhoids that do not prolapse). Impression:           - One 2 mm polyp in the descending colon, removed with                        a cold biopsy forceps. Resected and retrieved. Clip (MR                        conditional) was placed.                       - Non-bleeding internal hemorrhoids. Recommendation:       - Discharge patient to home.                       - Resume previous diet.                       - Continue present medications.                       - Await pathology results.                       - Repeat colonoscopy in 5 years if polyp adenoma and 10                        years if hyperplastic Procedure Code(s):    --- Professional ---  45380, Colonoscopy, flexible; with biopsy, single or                        multiple Diagnosis Code(s):    --- Professional ---                       R19.5, Other fecal abnormalities                       D12.4, Benign neoplasm of descending colon CPT copyright 2016 American Medical Association. All rights reserved. The codes documented in this report are preliminary and upon  coder review may  be revised to meet current compliance requirements. Lucilla Lame MD, MD 12/02/2016 9:30:16 AM This report has been signed electronically. Number of Addenda: 0 Note Initiated On: 12/02/2016 9:09 AM Scope Withdrawal Time: 0 hours 10 minutes 28 seconds  Total Procedure Duration: 0 hours 13 minutes 26 seconds       [facility]

## 2016-12-02 NOTE — Transfer of Care (Signed)
Immediate Anesthesia Transfer of Care Note  Patient: [name]  Procedure(s) Performed: COLONOSCOPY WITH PROPOFOL (N/A )  Patient Location: PACU  Anesthesia Type:MAC  Level of Consciousness: drowsy  Airway & Oxygen Therapy: Patient Spontanous Breathing and Patient connected to nasal cannula oxygen  Post-op Assessment: Report given to RN and Post -op Vital signs reviewed and stable  Post vital signs: Reviewed and stable  Last Vitals:  Vitals:   12/02/16 0824  BP: 127/76  Pulse: 86  Resp: 20  Temp: (!) 35.8 C  SpO2: 100%    Last Pain:  Vitals:   12/02/16 0824  TempSrc: Tympanic         Complications: No apparent anesthesia complications

## 2016-12-02 NOTE — Anesthesia Preprocedure Evaluation (Signed)
Anesthesia Evaluation  Patient identified by MRN, date of birth, ID band Patient awake    Reviewed: Allergy & Precautions, NPO status , Patient's Chart, lab work & pertinent test results, reviewed documented beta blocker date and time   Airway Mallampati: II  TM Distance: >3 FB     Dental  (+) Teeth Intact   Pulmonary neg pulmonary ROS,    Pulmonary exam normal        Cardiovascular hypertension, Pt. on medications and Pt. on home beta blockers Normal cardiovascular exam     Neuro/Psych CVA negative psych ROS   GI/Hepatic Neg liver ROS,   Endo/Other  diabetes  Renal/GU stones  negative genitourinary   Musculoskeletal negative musculoskeletal ROS (+)   Abdominal Normal abdominal exam  (+)   Peds negative pediatric ROS (+)  Hematology  (+) anemia ,   Anesthesia Other Findings   Reproductive/Obstetrics                             Anesthesia Physical Anesthesia Plan  ASA: III  Anesthesia Plan: General   Post-op Pain Management:    Induction: Intravenous  PONV Risk Score and Plan:   Airway Management Planned: Nasal Cannula  Additional Equipment:   Intra-op Plan:   Post-operative Plan:   Informed Consent: I have reviewed the patients History and Physical, chart, labs and discussed the procedure including the risks, benefits and alternatives for the proposed anesthesia with the patient or authorized representative who has indicated his/her understanding and acceptance.   Dental advisory given  Plan Discussed with: CRNA and Surgeon  Anesthesia Plan Comments:         Anesthesia Quick Evaluation

## 2016-12-03 ENCOUNTER — Encounter: Payer: Self-pay | Admitting: Gastroenterology

## 2016-12-03 LAB — SURGICAL PATHOLOGY

## 2016-12-04 ENCOUNTER — Encounter: Payer: Self-pay | Admitting: Gastroenterology

## 2017-01-12 ENCOUNTER — Other Ambulatory Visit: Payer: Self-pay | Admitting: Family Medicine

## 2017-01-12 DIAGNOSIS — Z8673 Personal history of transient ischemic attack (TIA), and cerebral infarction without residual deficits: Secondary | ICD-10-CM

## 2017-01-12 NOTE — Telephone Encounter (Signed)
Pt. daughter called requesting refill on generic aggrenox  25-200 mg

## 2017-01-13 MED ORDER — ASPIRIN-DIPYRIDAMOLE ER 25-200 MG PO CP12: ORAL_CAPSULE | ORAL | 5 refills | Status: DC

## 2017-06-30 ENCOUNTER — Other Ambulatory Visit: Payer: Self-pay | Admitting: Family Medicine

## 2017-06-30 DIAGNOSIS — E1169 Type 2 diabetes mellitus with other specified complication: Secondary | ICD-10-CM

## 2017-06-30 DIAGNOSIS — E785 Hyperlipidemia, unspecified: Principal | ICD-10-CM

## 2017-07-22 ENCOUNTER — Other Ambulatory Visit: Payer: BLUE CROSS/BLUE SHIELD

## 2017-07-28 ENCOUNTER — Ambulatory Visit (INDEPENDENT_AMBULATORY_CARE_PROVIDER_SITE_OTHER): Payer: BLUE CROSS/BLUE SHIELD | Admitting: Family Medicine

## 2017-07-28 ENCOUNTER — Encounter: Payer: Self-pay | Admitting: Family Medicine

## 2017-07-28 VITALS — BP 120/81 | HR 68 | Temp 98.0°F | Resp 16 | Ht 70.0 in | Wt 141.6 lb

## 2017-07-28 DIAGNOSIS — Z0000 Encounter for general adult medical examination without abnormal findings: Secondary | ICD-10-CM

## 2017-07-28 DIAGNOSIS — E785 Hyperlipidemia, unspecified: Secondary | ICD-10-CM

## 2017-07-28 DIAGNOSIS — Z8673 Personal history of transient ischemic attack (TIA), and cerebral infarction without residual deficits: Secondary | ICD-10-CM

## 2017-07-28 DIAGNOSIS — Z125 Encounter for screening for malignant neoplasm of prostate: Secondary | ICD-10-CM

## 2017-07-28 DIAGNOSIS — I10 Essential (primary) hypertension: Secondary | ICD-10-CM

## 2017-07-28 DIAGNOSIS — E1169 Type 2 diabetes mellitus with other specified complication: Secondary | ICD-10-CM

## 2017-07-28 DIAGNOSIS — E041 Nontoxic single thyroid nodule: Secondary | ICD-10-CM | POA: Diagnosis not present

## 2017-07-28 MED ORDER — METFORMIN HCL 500 MG PO TABS: 500.0000 mg | ORAL_TABLET | Freq: Two times a day (BID) | ORAL | 3 refills | Status: DC

## 2017-07-28 MED ORDER — CARVEDILOL 3.125 MG PO TABS: ORAL_TABLET | ORAL | 3 refills | Status: DC

## 2017-07-28 NOTE — Assessment & Plan Note (Addendum)
Prior normal TSH in past Asymptomatic Will check TSH with labs today for monitoring

## 2017-07-28 NOTE — Assessment & Plan Note (Signed)
Low to Avg risk patient, with prior reported negative screening - Last PSA normal 0.6 in 2018 - Clinically asymptomatic  Plan: 1. Reviewed prostate cancer screening guidelines and risks including potential prostate biopsy if abnormal PSA, proceed with yearly PSA for now, and anticipate DRE as needed especially if abnormal PSA or new symptoms

## 2017-07-28 NOTE — Patient Instructions (Addendum)
Thank you for coming to the office today.  Keep taking Plavix and Aspirin and follow-up with [facility] once a year next in 01/2018  Call to schedule for [facility] Doctor Appointment for Diabetes   [facility] [place], [place] [address] Phone: [phone]  Refilled Carvedilol and Metformin Continue all medicines  LABS TODAY - will release results to MyChart with information  DUE for FASTING BLOOD WORK (no food or drink after midnight before the lab appointment, only water or coffee without cream/sugar on the morning of)  SCHEDULE "Lab Only" visit in the morning at the clinic for lab draw in 1 YEAR  For Lab Results, once available within 2-3 days of blood draw, you can can log in to MyChart online to view your results and a brief explanation. Also, we can discuss results at next follow-up visit.  Please schedule a Follow-up Appointment to: Return in about 1 year (around 07/29/2018) for Annual Physical (then fasting lab AFTER apt).  If you have any other questions or concerns, please feel free to call the office or send a message through [place]. You may also schedule an earlier appointment if necessary.  Additionally, you may be receiving a survey about your experience at our office within a few days to 1 week by e-mail or mail. We value your feedback.  Nobie Putnam, DO [place]

## 2017-07-28 NOTE — Assessment & Plan Note (Addendum)
Previously fairly well controlled lipids on statin Last lipid panel 07/2016 Elevated ASCVD risk with prior CVA  Plan: Check fasting lipid lab today 1. Continue current meds - Pravastatin 40mg  daily 2. Continue Plavix 75 + ASA 81 secondary ASCVD risk reduction 3. Encourage improved lifestyle - low carb/cholesterol, reduce portion size, continue improving regular exercise 4. Follow-up 1 yr

## 2017-07-28 NOTE — Progress Notes (Signed)
Subjective:    Patient ID: [name], male    DOB: 20-Dec-1960, 57 y.o.   MRN: [id number]  [name] is a 57 y.o. male presenting on 07/28/2017 for Annual Exam  History provided by patient in [place] ([place]), and interpretation by [employer] [phone]. He is not accompanied by daughter today.   HPI  Here for Annual Physical and Fasting lab only check today.  CHRONIC DM, Type 2: Reports no concerns. Last A1c 6.1 in 2018. Due for repeat lab. CBGs: Avg 110-120s, Low none < 100, High < 150. Checks CBGs x 1 daily Meds: Metformin 500mg  BID Reports good compliance. Tolerating well w/o side-effects Currently on ACEi on Lisinopril 2.5mg  daily Taking Statin and on anti-platelet Lifestyle: - Diet (Improved diet overall, tries to maintain balanced diet, tries to limit sugars) - Exercise (Stays active working at [employer], but does not do regular exercise) - Followed by [employer] [facility] [employer], [facility] - last visit 1 year ago - he is due to schedule Annual DM Eye Visit Denies hypoglycemia  CHRONIC HTN:  Reports he checks BP at home, 120/80s on average. Mostly unchanged since previous visit. Current Meds - Carvedilol 3.125mg  HALF tab BID, Lisinopril 2.5mg  daily Reports good compliance, took meds today. Tolerating well, w/o complaints.  HYPERLIPIDEMIA: - Reports no concerns. Last lipid panel 07/2016 mostly controlled but elevated TG. Not fasting today, will return - Currently taking Pravastatin 40mg  daily, tolerating well without side effects or myalgias  History of CVA, without significant residual deficit - Reported prior history of CVA in 2013, has done well since without any significant residual deficits, he does have history of slight facial asymmetry but no functional deficit with weakness or sensation change. Previously taking Aggrenox for stroke risk reduction. Since last visit followed now by [facility] 01/2017, yearly visit next due 01/2018. He was  switched off Aggrenox and started on Plavix 75mg  daily and Aspirin 81mg  daily. He has done well on this regimen. No new concerns.  History of thyroid nodule Previous TSH normal. Due for re-check.  Health Maintenance:  Colon CA Screening: Last Colon cancer screening with Cologuard 07/24/16 POSITIVE, then referred for Colonoscopy (done by Dr Allen Norris [facility]), results with x 1 polyp hyperplastic benign, good for 10 years. Currently asymptomatic. No known family history of colon CA. UTD currently - next due for screening test colonoscopy by [facility] 11/2026  - Prostate CA Screening: No known personal or family history of prostate CA. Prior PSA reported normal. Last PSA 0.6 07/2016. Due for lab now. Asymptomatic prostate  - UTD routine Hepatitis C screen was negative 07/2016. Declines HIV - UTD Pneumovax-23, in DM, now next due age 68 for Prevnar-13   Depression screen [facility] 2/9 07/28/2017 02/05/2016 09/06/2015  Decreased Interest 0 0 0  Down, Depressed, Hopeless 0 0 0  PHQ - 2 Score 0 0 0    Past Medical History:  Diagnosis Date  . Cyst of thyroid 2013   right  . Hyperlipidemia   . Kidney stones   . Stroke [facility]) 2013   Past Surgical History:  Procedure Laterality Date  . APPENDECTOMY  2003  . COLONOSCOPY WITH PROPOFOL N/A 12/02/2016   Procedure: COLONOSCOPY WITH PROPOFOL;  Surgeon: Lucilla Lame, MD;  Location: [facility] ENDOSCOPY;  Service: Endoscopy;  Laterality: N/A;  . FINE NEEDLE ASPIRATION Right 09-28-13   thyroid nodule/BENIGN FOLLICULAR CELLS, COLLOID   Social History   Socioeconomic History  . Marital status: Married    Spouse name: Not on  file  . Number of children: Not on file  . Years of education: Not on file  . Highest education level: Not on file  Occupational History  . Not on file  Social Needs  . Financial resource strain: Not on file  . Food insecurity:    Worry: Not on file    Inability: Not on file  . Transportation needs:    Medical: Not on file    Non-medical: Not on  file  Tobacco Use  . Smoking status: Never Smoker  . Smokeless tobacco: Never Used  Substance and Sexual Activity  . Alcohol use: No  . Drug use: No  . Sexual activity: Not on file  Lifestyle  . Physical activity:    Days per week: Not on file    Minutes per session: Not on file  . Stress: Not on file  Relationships  . Social connections:    Talks on phone: Not on file    Gets together: Not on file    Attends religious service: Not on file    Active member of club or organization: Not on file    Attends meetings of clubs or organizations: Not on file    Relationship status: Not on file  . Intimate partner violence:    Fear of current or ex partner: Not on file    Emotionally abused: Not on file    Physically abused: Not on file    Forced sexual activity: Not on file  Other Topics Concern  . Not on file  Social History Narrative  . Not on file   Family History  Family history unknown: Yes   Current Outpatient Medications on File Prior to Visit  Medication Sig  . BAYER ASPIRIN PO Take 81 mg by mouth.  . clopidogrel (PLAVIX) 75 MG tablet Take 75 mg by mouth daily.  Marland Kitchen lisinopril (PRINIVIL,ZESTRIL) 2.5 MG tablet Take 2.5 mg by mouth daily.  . pravastatin (PRAVACHOL) 40 MG tablet TAKE 1 TABLET BY MOUTH EVERY DAY  . Multiple Vitamin (MULTI-VITAMINS) TABS Take by mouth.  . triamcinolone cream (KENALOG) 0.5 % Apply 1 application topically 2 (two) times daily. To affected areas, for up to 2 weeks. (Patient not taking: Reported on 07/28/2017)   No current facility-administered medications on file prior to visit.     Review of Systems  Constitutional: Negative for activity change, appetite change, chills, diaphoresis, fatigue and fever.  HENT: Negative for congestion and hearing loss.   Eyes: Negative for visual disturbance.  Respiratory: Negative for apnea, cough, choking, chest tightness, shortness of breath and wheezing.   Cardiovascular: Negative for chest pain, palpitations  and leg swelling.  Gastrointestinal: Negative for abdominal pain, anal bleeding, blood in stool, constipation, diarrhea, nausea and vomiting.  Endocrine: Negative for cold intolerance.  Genitourinary: Negative for decreased urine volume, difficulty urinating, dysuria, frequency, hematuria, testicular pain and urgency.  Musculoskeletal: Negative for arthralgias, back pain and neck pain.  Skin: Negative for rash.  Allergic/Immunologic: Negative for environmental allergies.  Neurological: Negative for dizziness, weakness, light-headedness, numbness and headaches.  Hematological: Negative for adenopathy.  Psychiatric/Behavioral: Negative for behavioral problems, dysphoric mood and sleep disturbance. The patient is not nervous/anxious.    Per HPI unless specifically indicated above     Objective:    BP 120/81   Pulse 68   Temp 98 F (36.7 C) (Oral)   Resp 16   Ht 5\' 10"  (1.778 m)   Wt 141 lb 9.6 oz (64.2 kg)   BMI 20.32 kg/m  Wt Readings from Last 3 Encounters:  07/28/17 141 lb 9.6 oz (64.2 kg)  12/02/16 145 lb (65.8 kg)  11/04/16 149 lb 12.8 oz (67.9 kg)    Physical Exam  Constitutional: He is oriented to person, place, and time. He appears well-developed and well-nourished. No distress.  Well-appearing, comfortable, cooperative  HENT:  Head: Normocephalic and atraumatic.  Mouth/Throat: Oropharynx is clear and moist.  Frontal / maxillary sinuses non-tender. Nares patent without purulence or edema. Bilateral TMs clear without erythema, effusion or bulging. Oropharynx clear without erythema, exudates, edema or asymmetry.  Eyes: Pupils are equal, round, and reactive to light. Conjunctivae and EOM are normal. Right eye exhibits no discharge. Left eye exhibits no discharge.  Neck: Normal range of motion. Neck supple. No thyromegaly present.  No carotid bruits  Cardiovascular: Normal rate, regular rhythm, normal heart sounds and intact distal pulses.  No murmur  heard. Pulmonary/Chest: Effort normal and breath sounds normal. No respiratory distress. He has no wheezes. He has no rales.  Abdominal: Soft. Bowel sounds are normal. He exhibits no distension and no mass. There is no tenderness.  Musculoskeletal: Normal range of motion. He exhibits no edema or tenderness.  Upper / Lower Extremities: - Normal muscle tone, strength bilateral upper extremities 5/5, lower extremities 5/5  Lymphadenopathy:    He has no cervical adenopathy.  Neurological: He is alert and oriented to person, place, and time.  Distal sensation intact to light touch all extremities  Baseline slightly asymmetrical mouth at rest residual from prior CVA.   Skin: Skin is warm and dry. No rash noted. He is not diaphoretic. No erythema.  Psychiatric: He has a normal mood and affect. His behavior is normal.  Well groomed, good eye contact, normal speech and thoughts  Nursing note and vitals reviewed.    Diabetic Foot Exam - Simple   Simple Foot Form Diabetic Foot exam was performed with the following findings:  Yes 07/28/2017  9:32 AM  Visual Inspection No deformities, no ulcerations, no other skin breakdown bilaterally:  Yes Sensation Testing Intact to touch and monofilament testing bilaterally:  Yes Pulse Check Posterior Tibialis and Dorsalis pulse intact bilaterally:  Yes Comments     Results for orders placed or performed during the hospital encounter of 12/02/16  Glucose, capillary  Result Value Ref Range   Glucose-Capillary 132 (H) 65 - 99 mg/dL  Surgical pathology  Result Value Ref Range   SURGICAL PATHOLOGY      Surgical Pathology CASE: [id number] PATIENT: Lyan Kuenzel Surgical Pathology Report     SPECIMEN SUBMITTED: A. Colon polyp, descending; cbx  CLINICAL HISTORY: None provided  PRE-OPERATIVE DIAGNOSIS: Positive cologuard R19.5  POST-OPERATIVE DIAGNOSIS: Colon polyp     DIAGNOSIS: A. COLON POLYP, DESCENDING; COLD BIOPSY: - HYPERPLASTIC  POLYP. - NEGATIVE FOR DYSPLASIA AND MALIGNANCY.   GROSS DESCRIPTION: A. Labeled: C BX descending colon polyp Tissue fragment(s): 1 Size: 0.5 cm Description: tan-yellow fragment  Entirely submitted in 1 cassette(s).  Final Diagnosis performed by Bryan Lemma, MD.  Electronically signed 12/03/2016 2:19:21PM    The electronic signature indicates that the named Attending Pathologist has evaluated the specimen  Technical component performed at [facility], [address], [place], [place] [address] Lab: [phone] Dir: Rush Farmer, MD, MMM  Professional component performed at [facility], [facility],  [place], [place], [place] [address] Lab: [phone] Dir: Dellia Nims. Reuel Derby, MD        Assessment & Plan:   Problem List Items Addressed This Visit  Essential hypertension    Well-controlled HTN - Home BP readings normal  History of CVA Note - low BP on Lisinopril 5mg    Plan:  1. Continue current BP regimen Carvedilol 3.125mg  HALF tab BID (dose =1.57), Lisinopril 2.5mg  daily 2. Encourage improved lifestyle - low sodium diet, regular exercise 3. Continue monitor BP outside office, bring readings to next visit, if persistently >140/90 or new symptoms notify office sooner 4. Follow-up 1 year for annual  Labs check today - f/u results      Relevant Medications   BAYER ASPIRIN PO   carvedilol (COREG) 3.125 MG tablet   Other Relevant Orders   CBC with Differential/Platelet   COMPLETE METABOLIC PANEL WITH GFR   History of CVA (cerebrovascular accident)    Stable clinically without residual deficits from prior CVA Has some baseline slightly asymmetrical mouth possibly residual, without change History CVA 2013 Off Aggrenox due to cost  Plan Continue yearly f/u with [facility] 01/2018 On Plavix 75mg  and ASA 81mg  DAPT per Neurology recommendations Continue Pravastatin for ASCVD risk reduction      Relevant Orders   CBC with Differential/Platelet    Hyperlipidemia associated with type 2 diabetes mellitus ([place])    Previously fairly well controlled lipids on statin Last lipid panel 07/2016 Elevated ASCVD risk with prior CVA  Plan: Check fasting lipid lab today 1. Continue current meds - Pravastatin 40mg  daily 2. Continue Plavix 75 + ASA 81 secondary ASCVD risk reduction 3. Encourage improved lifestyle - low carb/cholesterol, reduce portion size, continue improving regular exercise 4. Follow-up 1 yr      Relevant Medications   BAYER ASPIRIN PO   metFORMIN (GLUCOPHAGE) 500 MG tablet   carvedilol (COREG) 3.125 MG tablet   Other Relevant Orders   Lipid panel   Screening for prostate cancer    Low to Avg risk patient, with prior reported negative screening - Last PSA normal 0.6 in 2018 - Clinically asymptomatic  Plan: 1. Reviewed prostate cancer screening guidelines and risks including potential prostate biopsy if abnormal PSA, proceed with yearly PSA for now, and anticipate DRE as needed especially if abnormal PSA or new symptoms      Relevant Orders   PSA, Total with Reflex to PSA, Free   Thyroid nodule    Prior normal TSH in past Asymptomatic Will check TSH with labs today for monitoring      Relevant Medications   carvedilol (COREG) 3.125 MG tablet   Other Relevant Orders   TSH   Type 2 diabetes mellitus with other specified complication ([place])    Previously Well-controlled DM. Last A1c 6.1. Due for repeat today No known hypoglycemia or hyperglycemia at this time. Complications - other including hyperlipidemia specifically hypertriglyceridemia, and history of CVA - increases risk of future cardiovascular complications   Plan:  Will check A1c with labs today - f/u results 1. Continue current therapy - Metformin 500mg  BID - refiled 2. Encourage improved lifestyle - low carb, low sugar diet, reduce portion size, continue improving regular exercise 3. Check CBG, bring log to next visit for review 4. Continue  Plavix+ASA, ACEi Statin 5. DM Foot exam done today / Advised to schedule DM ophtho exam, send record - should schedule with [facility] 6. Follow-up 1 year - consider return sooner 6 months if significantly elevated A1c      Relevant Medications   BAYER ASPIRIN PO   metFORMIN (GLUCOPHAGE) 500 MG tablet   Other Relevant Orders   Hemoglobin A1c    Other  Visit Diagnoses    Annual physical exam    -  Primary   Relevant Orders   Hemoglobin A1c   CBC with Differential/Platelet   COMPLETE METABOLIC PANEL WITH GFR   Lipid panel    Updated Health Maintenance information - Next colonoscopy 11/2026 Reviewed recent lab results with patient Encouraged improvement to lifestyle with diet and exercise   Meds ordered this encounter  Medications  . metFORMIN (GLUCOPHAGE) 500 MG tablet    Sig: Take 1 tablet (500 mg total) by mouth 2 (two) times daily with a meal.    Dispense:  180 tablet    Refill:  3  . carvedilol (COREG) 3.125 MG tablet    Sig: TAKE 1/2 TABLET ORALLY TWICE A DAY.    Dispense:  90 tablet    Refill:  3    Follow up plan: Return in about 1 year (around 07/29/2018) for Annual Physical (then fasting lab AFTER apt).  Of note - patient's daughter, [name], called to review the status of his annual physical today, reviewed the above information with her, advised lab results are still pending since drawn today, will send to mychart. No medication changes, continue current plan follow-up 1 year unless we notify otherwise.  Nobie Putnam, [facility] 07/28/2017, 12:50 PM

## 2017-07-28 NOTE — Assessment & Plan Note (Signed)
Well-controlled HTN - Home BP readings normal  History of CVA Note - low BP on Lisinopril 5mg    Plan:  1. Continue current BP regimen Carvedilol 3.125mg  HALF tab BID (dose =1.57), Lisinopril 2.5mg  daily 2. Encourage improved lifestyle - low sodium diet, regular exercise 3. Continue monitor BP outside office, bring readings to next visit, if persistently >140/90 or new symptoms notify office sooner 4. Follow-up 1 year for annual  Labs check today - f/u results

## 2017-07-28 NOTE — Assessment & Plan Note (Signed)
Stable clinically without residual deficits from prior CVA Has some baseline slightly asymmetrical mouth possibly residual, without change History CVA 2013 Off Aggrenox due to cost  Plan Continue yearly f/u with [facility] 01/2018 On Plavix 75mg  and ASA 81mg  DAPT per Neurology recommendations Continue Pravastatin for ASCVD risk reduction

## 2017-07-28 NOTE — Assessment & Plan Note (Signed)
Previously Well-controlled DM. Last A1c 6.1. Due for repeat today No known hypoglycemia or hyperglycemia at this time. Complications - other including hyperlipidemia specifically hypertriglyceridemia, and history of CVA - increases risk of future cardiovascular complications   Plan:  Will check A1c with labs today - f/u results 1. Continue current therapy - Metformin 500mg  BID - refiled 2. Encourage improved lifestyle - low carb, low sugar diet, reduce portion size, continue improving regular exercise 3. Check CBG, bring log to next visit for review 4. Continue Plavix+ASA, ACEi Statin 5. DM Foot exam done today / Advised to schedule DM ophtho exam, send record - should schedule with [facility] 6. Follow-up 1 year - consider return sooner 6 months if significantly elevated A1c

## 2017-07-29 LAB — CBC WITH DIFFERENTIAL/PLATELET
BASOS ABS: 91 {cells}/uL (ref 0–200)
Basophils Relative: 1.3 %
Eosinophils Absolute: 189 cells/uL (ref 15–500)
Eosinophils Relative: 2.7 %
HCT: 35.2 % — ABNORMAL LOW (ref 38.5–50.0)
HEMOGLOBIN: 11.6 g/dL — AB (ref 13.2–17.1)
Lymphs Abs: 1680 cells/uL (ref 850–3900)
MCH: 29.7 pg (ref 27.0–33.0)
MCHC: 33 g/dL (ref 32.0–36.0)
MCV: 90.3 fL (ref 80.0–100.0)
MONOS PCT: 7.9 %
MPV: 11.2 fL (ref 7.5–12.5)
NEUTROS ABS: 4487 {cells}/uL (ref 1500–7800)
NEUTROS PCT: 64.1 %
Platelets: 242 10*3/uL (ref 140–400)
RBC: 3.9 10*6/uL — AB (ref 4.20–5.80)
RDW: 13.5 % (ref 11.0–15.0)
Total Lymphocyte: 24 %
WBC mixed population: 553 cells/uL (ref 200–950)
WBC: 7 10*3/uL (ref 3.8–10.8)

## 2017-07-29 LAB — LIPID PANEL
Cholesterol: 186 mg/dL (ref <200)
HDL: 41 mg/dL (ref >40)
LDL Cholesterol (Calc): 104 mg/dL (calc) — ABNORMAL HIGH
NON-HDL CHOLESTEROL (CALC): 145 mg/dL — AB (ref <130)
Total CHOL/HDL Ratio: 4.5 (calc) (ref <5.0)
Triglycerides: 288 mg/dL — ABNORMAL HIGH (ref <150)

## 2017-07-29 LAB — TSH: TSH: 1.78 m[IU]/L (ref 0.40–4.50)

## 2017-07-29 LAB — COMPLETE METABOLIC PANEL WITH GFR
AG RATIO: 1.4 (calc) (ref 1.0–2.5)
ALBUMIN MSPROF: 4.1 g/dL (ref 3.6–5.1)
ALT: 16 U/L (ref 9–46)
AST: 14 U/L (ref 10–35)
Alkaline phosphatase (APISO): 61 U/L (ref 40–115)
BILIRUBIN TOTAL: 0.9 mg/dL (ref 0.2–1.2)
BUN / CREAT RATIO: 18 (calc) (ref 6–22)
BUN: 29 mg/dL — ABNORMAL HIGH (ref 7–25)
CHLORIDE: 105 mmol/L (ref 98–110)
CO2: 25 mmol/L (ref 20–32)
Calcium: 9.2 mg/dL (ref 8.6–10.3)
Creat: 1.57 mg/dL — ABNORMAL HIGH (ref 0.70–1.33)
GFR, EST AFRICAN AMERICAN: 55 mL/min/{1.73_m2} — AB (ref >=60)
GFR, EST NON AFRICAN AMERICAN: 48 mL/min/{1.73_m2} — AB (ref >=60)
GLOBULIN: 3 g/dL (ref 1.9–3.7)
Glucose, Bld: 120 mg/dL — ABNORMAL HIGH (ref 65–99)
POTASSIUM: 4.1 mmol/L (ref 3.5–5.3)
SODIUM: 141 mmol/L (ref 135–146)
TOTAL PROTEIN: 7.1 g/dL (ref 6.1–8.1)

## 2017-07-29 LAB — HEMOGLOBIN A1C
HEMOGLOBIN A1C: 6.6 %{Hb} — AB (ref <5.7)
MEAN PLASMA GLUCOSE: 143 (calc)
eAG (mmol/L): 7.9 (calc)

## 2017-07-29 LAB — PSA, TOTAL WITH REFLEX TO PSA, FREE: PSA, Total: 0.5 ng/mL (ref <=4.0)

## 2017-07-30 ENCOUNTER — Encounter: Payer: Self-pay | Admitting: Family Medicine

## 2017-07-30 DIAGNOSIS — N183 Chronic kidney disease, stage 3 unspecified: Secondary | ICD-10-CM | POA: Insufficient documentation

## 2017-07-31 ENCOUNTER — Encounter: Payer: Self-pay | Admitting: Family Medicine

## 2017-07-31 DIAGNOSIS — E785 Hyperlipidemia, unspecified: Principal | ICD-10-CM

## 2017-07-31 DIAGNOSIS — E1169 Type 2 diabetes mellitus with other specified complication: Secondary | ICD-10-CM

## 2017-08-04 MED ORDER — ROSUVASTATIN CALCIUM 20 MG PO TABS: 20.0000 mg | ORAL_TABLET | Freq: Every day | ORAL | 3 refills | Status: DC

## 2017-08-04 NOTE — Addendum Note (Signed)
Addended by: Olin Hauser on: 08/04/2017 12:39 PM   Modules accepted: Orders

## 2017-08-06 ENCOUNTER — Other Ambulatory Visit: Payer: Self-pay | Admitting: Family Medicine

## 2017-08-06 DIAGNOSIS — N183 Chronic kidney disease, stage 3 unspecified: Secondary | ICD-10-CM

## 2017-08-06 DIAGNOSIS — E785 Hyperlipidemia, unspecified: Secondary | ICD-10-CM

## 2017-08-06 DIAGNOSIS — E1169 Type 2 diabetes mellitus with other specified complication: Secondary | ICD-10-CM

## 2017-09-11 ENCOUNTER — Other Ambulatory Visit: Payer: Self-pay | Admitting: Family Medicine

## 2017-09-11 DIAGNOSIS — I10 Essential (primary) hypertension: Secondary | ICD-10-CM

## 2017-09-11 DIAGNOSIS — E119 Type 2 diabetes mellitus without complications: Secondary | ICD-10-CM

## 2018-05-03 ENCOUNTER — Other Ambulatory Visit: Payer: Self-pay | Admitting: Family Medicine

## 2018-05-03 DIAGNOSIS — I10 Essential (primary) hypertension: Secondary | ICD-10-CM

## 2018-08-01 ENCOUNTER — Other Ambulatory Visit: Payer: Self-pay | Admitting: Family Medicine

## 2018-08-01 DIAGNOSIS — E1169 Type 2 diabetes mellitus with other specified complication: Secondary | ICD-10-CM

## 2018-08-01 DIAGNOSIS — E785 Hyperlipidemia, unspecified: Secondary | ICD-10-CM

## 2018-08-10 ENCOUNTER — Encounter: Payer: BLUE CROSS/BLUE SHIELD | Admitting: Family Medicine

## 2018-09-06 ENCOUNTER — Other Ambulatory Visit: Payer: Self-pay | Admitting: Family Medicine

## 2018-09-06 DIAGNOSIS — E119 Type 2 diabetes mellitus without complications: Secondary | ICD-10-CM

## 2018-09-06 DIAGNOSIS — I10 Essential (primary) hypertension: Secondary | ICD-10-CM

## 2018-09-08 ENCOUNTER — Other Ambulatory Visit: Payer: Self-pay

## 2018-09-08 DIAGNOSIS — Z125 Encounter for screening for malignant neoplasm of prostate: Secondary | ICD-10-CM

## 2018-09-08 DIAGNOSIS — Z0000 Encounter for general adult medical examination without abnormal findings: Secondary | ICD-10-CM

## 2018-09-08 DIAGNOSIS — E1169 Type 2 diabetes mellitus with other specified complication: Secondary | ICD-10-CM

## 2018-09-08 DIAGNOSIS — N183 Chronic kidney disease, stage 3 unspecified: Secondary | ICD-10-CM

## 2018-09-08 DIAGNOSIS — E041 Nontoxic single thyroid nodule: Secondary | ICD-10-CM

## 2018-09-08 DIAGNOSIS — I10 Essential (primary) hypertension: Secondary | ICD-10-CM

## 2018-09-09 ENCOUNTER — Encounter: Payer: Self-pay | Admitting: Family Medicine

## 2018-10-14 ENCOUNTER — Encounter: Payer: Self-pay | Admitting: Family Medicine

## 2018-11-16 ENCOUNTER — Encounter: Payer: Self-pay | Admitting: Family Medicine

## 2018-12-28 ENCOUNTER — Encounter: Payer: Self-pay | Admitting: Family Medicine

## 2019-01-27 ENCOUNTER — Other Ambulatory Visit: Payer: Self-pay | Admitting: Family Medicine

## 2019-01-27 DIAGNOSIS — I10 Essential (primary) hypertension: Secondary | ICD-10-CM

## 2019-01-27 DIAGNOSIS — Z125 Encounter for screening for malignant neoplasm of prostate: Secondary | ICD-10-CM

## 2019-01-27 DIAGNOSIS — E1169 Type 2 diabetes mellitus with other specified complication: Secondary | ICD-10-CM

## 2019-01-27 DIAGNOSIS — Z0000 Encounter for general adult medical examination without abnormal findings: Secondary | ICD-10-CM

## 2019-01-27 DIAGNOSIS — E041 Nontoxic single thyroid nodule: Secondary | ICD-10-CM

## 2019-01-27 DIAGNOSIS — D509 Iron deficiency anemia, unspecified: Secondary | ICD-10-CM

## 2019-01-27 DIAGNOSIS — N1831 Chronic kidney disease, stage 3a: Secondary | ICD-10-CM

## 2019-01-28 ENCOUNTER — Other Ambulatory Visit: Payer: Self-pay

## 2019-01-28 DIAGNOSIS — E041 Nontoxic single thyroid nodule: Secondary | ICD-10-CM

## 2019-01-28 DIAGNOSIS — Z125 Encounter for screening for malignant neoplasm of prostate: Secondary | ICD-10-CM

## 2019-01-28 DIAGNOSIS — Z0000 Encounter for general adult medical examination without abnormal findings: Secondary | ICD-10-CM

## 2019-01-28 DIAGNOSIS — I10 Essential (primary) hypertension: Secondary | ICD-10-CM

## 2019-01-28 DIAGNOSIS — D509 Iron deficiency anemia, unspecified: Secondary | ICD-10-CM

## 2019-01-28 DIAGNOSIS — E785 Hyperlipidemia, unspecified: Secondary | ICD-10-CM

## 2019-01-28 DIAGNOSIS — E1169 Type 2 diabetes mellitus with other specified complication: Secondary | ICD-10-CM

## 2019-01-28 DIAGNOSIS — N1831 Chronic kidney disease, stage 3a: Secondary | ICD-10-CM

## 2019-01-29 LAB — COMPLETE METABOLIC PANEL WITH GFR
AG Ratio: 1.5 (calc) (ref 1.0–2.5)
ALT: 15 U/L (ref 9–46)
AST: 19 U/L (ref 10–35)
Albumin: 4.2 g/dL (ref 3.6–5.1)
Alkaline phosphatase (APISO): 43 U/L (ref 35–144)
BUN/Creatinine Ratio: 18 (calc) (ref 6–22)
BUN: 35 mg/dL — ABNORMAL HIGH (ref 7–25)
CO2: 27 mmol/L (ref 20–32)
Calcium: 9.8 mg/dL (ref 8.6–10.3)
Chloride: 108 mmol/L (ref 98–110)
Creat: 1.96 mg/dL — ABNORMAL HIGH (ref 0.70–1.25)
GFR, Est African American: 42 mL/min/{1.73_m2} — ABNORMAL LOW (ref >=60)
GFR, Est Non African American: 36 mL/min/{1.73_m2} — ABNORMAL LOW (ref >=60)
Globulin: 2.8 g/dL (calc) (ref 1.9–3.7)
Glucose, Bld: 125 mg/dL — ABNORMAL HIGH (ref 65–99)
Potassium: 4.2 mmol/L (ref 3.5–5.3)
Sodium: 140 mmol/L (ref 135–146)
Total Bilirubin: 0.7 mg/dL (ref 0.2–1.2)
Total Protein: 7 g/dL (ref 6.1–8.1)

## 2019-01-29 LAB — CBC WITH DIFFERENTIAL/PLATELET
Absolute Monocytes: 527 cells/uL (ref 200–950)
Basophils Absolute: 68 cells/uL (ref 0–200)
Basophils Relative: 1.1 %
Eosinophils Absolute: 217 cells/uL (ref 15–500)
Eosinophils Relative: 3.5 %
HCT: 38.3 % — ABNORMAL LOW (ref 38.5–50.0)
Hemoglobin: 12.3 g/dL — ABNORMAL LOW (ref 13.2–17.1)
Lymphs Abs: 2127 cells/uL (ref 850–3900)
MCH: 29.6 pg (ref 27.0–33.0)
MCHC: 32.1 g/dL (ref 32.0–36.0)
MCV: 92.1 fL (ref 80.0–100.0)
MPV: 11.4 fL (ref 7.5–12.5)
Monocytes Relative: 8.5 %
Neutro Abs: 3261 cells/uL (ref 1500–7800)
Neutrophils Relative %: 52.6 %
Platelets: 208 10*3/uL (ref 140–400)
RBC: 4.16 10*6/uL — ABNORMAL LOW (ref 4.20–5.80)
RDW: 13 % (ref 11.0–15.0)
Total Lymphocyte: 34.3 %
WBC: 6.2 10*3/uL (ref 3.8–10.8)

## 2019-01-29 LAB — LIPID PANEL
Cholesterol: 99 mg/dL (ref <200)
HDL: 54 mg/dL (ref >=40)
LDL Cholesterol (Calc): 26 mg/dL (calc)
Non-HDL Cholesterol (Calc): 45 mg/dL (calc) (ref <130)
Total CHOL/HDL Ratio: 1.8 (calc) (ref <5.0)
Triglycerides: 107 mg/dL (ref <150)

## 2019-01-29 LAB — TSH: TSH: 1.34 mIU/L (ref 0.40–4.50)

## 2019-01-29 LAB — HEMOGLOBIN A1C
Hgb A1c MFr Bld: 6.6 % of total Hgb — ABNORMAL HIGH (ref <5.7)
Mean Plasma Glucose: 143 (calc)
eAG (mmol/L): 7.9 (calc)

## 2019-01-29 LAB — PSA: PSA: 0.9 ng/mL (ref <=4.0)

## 2019-02-01 ENCOUNTER — Other Ambulatory Visit: Payer: Self-pay

## 2019-02-01 ENCOUNTER — Ambulatory Visit (INDEPENDENT_AMBULATORY_CARE_PROVIDER_SITE_OTHER): Payer: 59 | Admitting: Family Medicine

## 2019-02-01 ENCOUNTER — Encounter: Payer: Self-pay | Admitting: Family Medicine

## 2019-02-01 VITALS — Ht 70.0 in | Wt 145.0 lb

## 2019-02-01 DIAGNOSIS — I129 Hypertensive chronic kidney disease with stage 1 through stage 4 chronic kidney disease, or unspecified chronic kidney disease: Secondary | ICD-10-CM

## 2019-02-01 DIAGNOSIS — N1832 Chronic kidney disease, stage 3b: Secondary | ICD-10-CM

## 2019-02-01 DIAGNOSIS — R7989 Other specified abnormal findings of blood chemistry: Secondary | ICD-10-CM

## 2019-02-01 DIAGNOSIS — I10 Essential (primary) hypertension: Secondary | ICD-10-CM

## 2019-02-01 DIAGNOSIS — N183 Chronic kidney disease, stage 3 unspecified: Secondary | ICD-10-CM

## 2019-02-01 DIAGNOSIS — E1121 Type 2 diabetes mellitus with diabetic nephropathy: Secondary | ICD-10-CM | POA: Diagnosis not present

## 2019-02-01 DIAGNOSIS — E785 Hyperlipidemia, unspecified: Secondary | ICD-10-CM | POA: Diagnosis not present

## 2019-02-01 DIAGNOSIS — Z0000 Encounter for general adult medical examination without abnormal findings: Secondary | ICD-10-CM | POA: Diagnosis not present

## 2019-02-01 DIAGNOSIS — E1169 Type 2 diabetes mellitus with other specified complication: Secondary | ICD-10-CM | POA: Diagnosis not present

## 2019-02-01 NOTE — Assessment & Plan Note (Signed)
Well-controlled HTN - Home BP readings normal  History of CVA Complication with [id number] advancing w/ elevated Cr / reduced GFR - uncertain if acute on chronic or chronic progression   Plan:  1. HOLD Lisinopril 2.5mg  daily - due to elevated Creatinine - CKD -  Continue current BP regimen Carvedilol 3.125mg  HALF tab BID (dose =1.57) 2. Encourage improved lifestyle - low sodium diet, regular exercise 3. Continue monitor BP outside office, bring readings to next visit, if persistently >140/90 or new symptoms notify office sooner  F/u 6 months, see CKD - referral to Nephrology today

## 2019-02-01 NOTE — Assessment & Plan Note (Signed)
Concern with progression of CKD III to stage IIIb now Elevated creatinine from prior 1.15 to 1.5 now up to 1.96 with GFR reduced to 36 He was lost to follow-up in 2020, no labs - therefore difficult to determine if this is acute on chronic with poor hydration or if this is chronic gradual progression  Plan - Improve hydration as discussed - Limit NSAIDs - HOLD Lisinporil 2.5mg  daily for now - Continue Metformin 500mg  BID low dose with GFR 36 but can defer to Nephrology to determine if this is still appropriate or if we can consider alternative therapy such as GLP vs DPP4 - Referral to [facility] for further work-up and repeat labs renal panel, based on chart review he does have a longer history of CKD episodic worsening back to 2013 and has episodic elevated Creatinine to 1.6-1.9 range in past.  If he cannot get in soon enough within 2-4 weeks to check blood panel again from Nephrology, they can call us to check repeat chemistry if needed.  Follow-up 6 months to monitor progress and updates from Nephrology

## 2019-02-01 NOTE — Patient Instructions (Addendum)
Thank you for coming to the office today.  Stay tuned for call from kidney specialist  Questions to ask them - Acute on Chronic? Or Chronic? They should re-check lab, if not able to do this soon enough call our office we can re-check in 2-4 weeks.  Should Lisionpril be restarted? I stopped it today for now until you talk to Kidney specialist. Also should metformin be stopped or changed based on kidney function.  [facility] [address] [place],  [address] Phone: [phone]   Please call [facility] to schedule Diabetic Eye exam and have them fax [place] results  Recent Labs    01/28/19 1029  HGBA1C 6.6*    Otherwise labs are good, keep on other meds    Please schedule a Follow-up Appointment to: Return in about 6 months (around 08/01/2019) for 6 month DM A1c, Foot exam, CKD nephrology.  If you have any other questions or concerns, please feel free to call the office or send a message through [place]. You may also schedule an earlier appointment if necessary.  Additionally, you may be receiving a survey about your experience at our office within a few days to 1 week by e-mail or mail. We value your feedback.  Nobie Putnam, DO [facility]

## 2019-02-01 NOTE — Progress Notes (Signed)
Subjective:    Patient ID: [name], male    DOB: Aug 10, 1960, 59 y.o.   MRN: [id number]  [name] is a 59 y.o. male presenting on 02/01/2019 for Annual Exam (fong [phone] # [phone])  Virtual / Telehealth Encounter - Telephone  The purpose of this virtual visit is to provide medical care while limiting exposure to the novel coronavirus (COVID19) for both patient and office staff.  Consent was obtained for remote visit:  Yes.    Answered questions that patient had about telehealth interaction:  Yes.   I discussed the limitations, risks, security and privacy concerns of performing an evaluation and management service by video/telephone. I also discussed with the patient that there may be a patient responsible charge related to this service. The patient expressed understanding and agreed to proceed.  Patient Location: Home Provider Location: [facility] (Office)  History provided by patient in [place] ([place] and [employer] listed above.   Additionally end of phone call, patient's daughter [name] on the line to speak as well in English to review course.  HPI  Here for Annual Physical and Fasting lab only check today.  CHRONIC DM, Type 2 w/ CKD-3b Reports no concerns. Last A1c 6.6 (2021). Lab showed elevated Creatinine to 1.96 and reduced eGFR 36, compared to past 2018-2019 1.15 to 1.57 range. However similar to 1.96 in 2013. CBGs: Avg100-120s, [place] < 100, High< 150. Checks CBGsx 1 daily Meds:Metformin 558m BID Reports good compliance. Tolerating well w/o side-effects Currentlyon ACEi on Lisinopril 2.558mdaily Taking Statin and on anti-platelet Lifestyle: - Regarding hydration, he says drinks 1-2 cups water a day, not hydrated well. - Diet (Improved diet overall, tries to maintain balanced diet, tries to limit sugars) - Exercise (Stays active working at re[employer]but does not do regular exercise) - [facility] visit 1 year ago - he is due to schedule Annual DM Eye Visit Denies hypoglycemia  CHRONIC HTN:  Reportshe checks BP at home, 120/80s on average. Mostly unchanged since previous visit. See above CKD-III Current Meds -Carvedilol 3.12515mALF tab BID, Lisinopril 2.5mg40mily Reports good compliance, took meds today. Tolerating well, w/o complaints.  HYPERLIPIDEMIA: - Reports no concerns. Last lipid panel1/2021 very well controlled, low LDL 26, normal HDL 54 and low TG 107, previously had high TG. - Currently taking Rosuvastatin 20mg35mly, tolerating well without side effects or myalgias  History of CVA, without significant residual deficit - Reported prior history of CVA in 2013, has done well since without any significant residual deficits, he does have history of slight facial asymmetry but no functional deficit with weakness or sensation change. Previously taking Aggrenox for stroke risk reduction. - [facility] switched off Aggrenox and started on Plavix 75mg 25my and Aspirin 81mg d6m Today he continues to do well on combination of Plavix 75mg da42mand ASA 81mg dai22mHistory of thyroid nodule Normal TSH lab  Health Maintenance:  Colon CA Screening: Last Colon cancer screening with Cologuard 07/24/16 POSITIVE, then referred for Colonoscopy (done by Dr Wohl AGI)Allen Norrissults with x 1 polyp hyperplastic benign, good for 10 years, done 12/02/16 - next due in 2028. Currently asymptomatic. No known family history of colon CA.  - Prostate CA Screening: No known personalor family history of prostate CA. Prior PSA reported normal. Last PSA 0.9 (01/2019). Asymptomatic prostate  - UTD routine Hepatitis C screen was negative 07/2016. Declines HIV - UTD Pneumovax-23, in DM, now  next due age 73 for Prevnar-12  Depression screen [facility] 2/9 02/01/2019 07/28/2017 02/05/2016  Decreased Interest 0 0 0  Down, Depressed, Hopeless 0 0 0  PHQ - 2 Score 0 0 0     Past Medical History:  Diagnosis Date  . Cyst of thyroid 2013   right  . Hyperlipidemia   . Kidney stones   . Stroke [facility]) 2013   Past Surgical History:  Procedure Laterality Date  . APPENDECTOMY  2003  . COLONOSCOPY WITH PROPOFOL N/A 12/02/2016   Procedure: COLONOSCOPY WITH PROPOFOL;  Surgeon: Lucilla Lame, MD;  Location: [facility] ENDOSCOPY;  Service: Endoscopy;  Laterality: N/A;  . FINE NEEDLE ASPIRATION Right 09-28-13   thyroid nodule/BENIGN FOLLICULAR CELLS, COLLOID   Social History   Socioeconomic History  . Marital status: Married    Spouse name: Not on file  . Number of children: Not on file  . Years of education: Not on file  . Highest education level: Not on file  Occupational History  . Not on file  Tobacco Use  . Smoking status: Never Smoker  . Smokeless tobacco: Never Used  Substance and Sexual Activity  . Alcohol use: No  . Drug use: No  . Sexual activity: Not on file  Other Topics Concern  . Not on file  Social History Narrative  . Not on file   Social Determinants of Health   Financial Resource Strain:   . Difficulty of Paying Living Expenses: Not on file  Food Insecurity:   . Worried About Charity fundraiser in the Last Year: Not on file  . Ran Out of Food in the Last Year: Not on file  Transportation Needs:   . Lack of Transportation (Medical): Not on file  . Lack of Transportation (Non-Medical): Not on file  Physical Activity:   . Days of Exercise per Week: Not on file  . Minutes of Exercise per Session: Not on file  Stress:   . Feeling of Stress : Not on file  Social Connections:   . Frequency of Communication with Friends and Family: Not on file  . Frequency of Social Gatherings with Friends and Family: Not on file  . Attends Religious Services: Not on file  . Active Member of Clubs or Organizations: Not on file  . Attends Archivist Meetings: Not on file  . Marital Status: Not on file  Intimate Partner Violence:   . Fear of  Current or Ex-Partner: Not on file  . Emotionally Abused: Not on file  . Physically Abused: Not on file  . Sexually Abused: Not on file   Family History  Family history unknown: Yes   Current Outpatient Medications on File Prior to Visit  Medication Sig  . BAYER ASPIRIN PO Take 81 mg by mouth.  . carvedilol (COREG) 3.125 MG tablet TAKE 1/2 TABLET ORALLY TWICE A DAY.  Marland Kitchen clopidogrel (PLAVIX) 75 MG tablet Take 75 mg by mouth daily.  . metFORMIN (GLUCOPHAGE) 500 MG tablet TAKE 1 TABLET (500 MG TOTAL) BY MOUTH 2 (TWO) TIMES DAILY WITH A MEAL.  . Multiple Vitamin (MULTI-VITAMINS) TABS Take by mouth.  . rosuvastatin (CRESTOR) 20 MG tablet TAKE 1 TABLET BY MOUTH EVERY DAY  . triamcinolone cream (KENALOG) 0.5 % Apply 1 application topically 2 (two) times daily. To affected areas, for up to 2 weeks.   No current facility-administered medications on file prior to visit.    Review of Systems  Constitutional: Negative for activity change, appetite change, chills, diaphoresis,  fatigue and fever.  HENT: Negative for congestion and hearing loss.   Eyes: Negative for visual disturbance.  Respiratory: Negative for apnea, cough, chest tightness, shortness of breath and wheezing.   Cardiovascular: Negative for chest pain, palpitations and leg swelling.  Gastrointestinal: Negative for abdominal pain, anal bleeding, blood in stool, constipation, diarrhea, nausea and vomiting.  Endocrine: Negative for cold intolerance.  Genitourinary: Negative for difficulty urinating, dysuria, frequency and hematuria.  Musculoskeletal: Negative for arthralgias, back pain and neck pain.  Skin: Negative for rash.  Allergic/Immunologic: Negative for environmental allergies.  Neurological: Negative for dizziness, weakness, light-headedness, numbness and headaches.  Hematological: Negative for adenopathy.  Psychiatric/Behavioral: Negative for behavioral problems, dysphoric mood and sleep disturbance. The patient is not  nervous/anxious.    Per HPI unless specifically indicated above     Objective:    Ht _0  (1.778 m)   Wt 145 lb (65.8 kg)   BMI 20.81 kg/m   Wt Readings from Last 3 Encounters:  02/01/19 145 lb (65.8 kg)  07/28/17 141 lb 9.6 oz (64.2 kg)  12/02/16 145 lb (65.8 kg)    Physical Exam   Virtual telephone visit. No physical exam performed in office today.  Results for orders placed or performed in visit on 01/28/19  TSH  Result Value Ref Range   TSH 1.34 0.40 - 4.50 mIU/L  PSA  Result Value Ref Range   PSA 0.9 < OR = 4.0 ng/mL  Lipid panel  Result Value Ref Range   Cholesterol 99 <200 mg/dL   HDL 54 > OR = 40 mg/dL   Triglycerides 107 <150 mg/dL   LDL Cholesterol (Calc) 26 mg/dL (calc)   Total CHOL/HDL Ratio 1.8 <5.0 (calc)   Non-HDL Cholesterol (Calc) 45 <130 mg/dL (calc)  COMPLETE METABOLIC PANEL WITH GFR  Result Value Ref Range   Glucose, Bld 125 (H) 65 - 99 mg/dL   BUN 35 (H) 7 - 25 mg/dL   Creat 1.96 (H) 0.70 - 1.25 mg/dL   GFR, Est Non African American 36 (L) > OR = 60 mL/min/1.46m   GFR, Est African American 42 (L) > OR = 60 mL/min/1.759m  BUN/Creatinine Ratio 18 6 - 22 (calc)   Sodium 140 135 - 146 mmol/L   Potassium 4.2 3.5 - 5.3 mmol/L   Chloride 108 98 - 110 mmol/L   CO2 27 20 - 32 mmol/L   Calcium 9.8 8.6 - 10.3 mg/dL   Total Protein 7.0 6.1 - 8.1 g/dL   Albumin 4.2 3.6 - 5.1 g/dL   Globulin 2.8 1.9 - 3.7 g/dL (calc)   AG Ratio 1.5 1.0 - 2.5 (calc)   Total Bilirubin 0.7 0.2 - 1.2 mg/dL   Alkaline phosphatase (APISO) 43 35 - 144 U/L   AST 19 10 - 35 U/L   ALT 15 9 - 46 U/L  CBC with Differential/Platelet  Result Value Ref Range   WBC 6.2 3.8 - 10.8 Thousand/uL   RBC 4.16 (L) 4.20 - 5.80 Million/uL   Hemoglobin 12.3 (L) 13.2 - 17.1 g/dL   HCT 38.3 (L) 38.5 - 50.0 %   MCV 92.1 80.0 - 100.0 fL   MCH 29.6 27.0 - 33.0 pg   MCHC 32.1 32.0 - 36.0 g/dL   RDW 13.0 11.0 - 15.0 %   Platelets 208 140 - 400 Thousand/uL   MPV 11.4 7.5 - 12.5 fL   Neutro  Abs 3,261 1,500 - 7,800 cells/uL   Lymphs Abs 2,127 850 - 3,900 cells/uL  Absolute Monocytes 527 200 - 950 cells/uL   Eosinophils Absolute 217 15 - 500 cells/uL   Basophils Absolute 68 0 - 200 cells/uL   Neutrophils Relative % 52.6 %   Total Lymphocyte 34.3 %   Monocytes Relative 8.5 %   Eosinophils Relative 3.5 %   Basophils Relative 1.1 %  Hemoglobin A1c  Result Value Ref Range   Hgb A1c MFr Bld 6.6 (H) <5.7 % of total Hgb   Mean Plasma Glucose 143 (calc)   eAG (mmol/L) 7.9 (calc)      Assessment & Plan:   Problem List Items Addressed This Visit    Hyperlipidemia associated with type 2 diabetes mellitus ([place])    Well controlled cholesterol panel on current statin Previous changed from pravastatin to rosuvastatin Last lipid panel 01/2019 Elevated ASCVD risk with prior CVA  Plan: 1. Continue current meds - Rosuvastatin 53m daily 2. Continue Plavix 75 + ASA 81 secondary ASCVD risk reduction 3. Encourage improved lifestyle - low carb/cholesterol, reduce portion size, continue improving regular exercise        Controlled type 2 diabetes mellitus with diabetic nephropathy, without long-term current use of insulin (HCC)    Controlled DM A1c 6.6 No known hypoglycemia or hyperglycemia at this time. Complications - Nephropathy with CKDIIIb now elevated Creatinine / reduced GFR now, other including hyperlipidemia specifically hypertriglyceridemia, and history of CVA - increases risk of future cardiovascular complications   Plan:  1. Continue current therapy - Metformin 5033mBID - for now - discussed w/ patient and his daughter that my concern is if his GFR is reduced he may not be appropriate candidate for continuing metformin therapy, he can discuss with Nephrology at upcoming visit after renal panel is re-checked. May consider oral GLP1 or injectable GLP1 in future or DPP4 2. Encourage improved lifestyle - low carb, low sugar diet, reduce portion size, continue improving regular  exercise 3. Check CBG, bring log to next visit for review 4. Continue Plavix+ASA, ACEi Statin 5. Advised to schedule DM ophtho exam, send record - should schedule with [facility]  F/u 6 months repeat A1c      Relevant Orders   Ambulatory referral to Nephrology   CKD (chronic kidney disease), stage III    Concern with progression of CKD III to stage IIIb now Elevated creatinine from prior 1.15 to 1.5 now up to 1.96 with GFR reduced to 36 He was lost to follow-up in 2020, no labs - therefore difficult to determine if this is acute on chronic with poor hydration or if this is chronic gradual progression  Plan - Improve hydration as discussed - Limit NSAIDs - HOLD Lisinporil 2.64m63maily for now - Continue Metformin 500m564mD low dose with GFR 36 but can defer to Nephrology to determine if this is still appropriate or if we can consider alternative therapy such as GLP vs DPP4 - [facility]erral to [facility] further work-up and repeat labs renal panel, based on chart review he does have a longer history of CKD episodic worsening back to 2013 and has episodic elevated Creatinine to 1.6-1.9 range in past.  If he cannot get in soon enough within 2-4 weeks to check blood panel again from Nephrology, they can call us [place] repeat chemistry if needed.  Follow-up 6 months to monitor progress and updates from Nephrology      Relevant Orders   Ambulatory referral to Nephrology   Benign hypertension with CKD (chronic kidney disease) stage III  Well-controlled HTN - Home BP readings normal  History of CVA Complication with YYY-1H advancing w/ elevated Cr / reduced GFR - uncertain if acute on chronic or chronic progression   Plan:  1. HOLD Lisinopril 2.77m daily - due to elevated Creatinine - CKD -  Continue current BP regimen Carvedilol 3.1214mHALF tab BID (dose =1.57) 2. Encourage improved lifestyle - low sodium diet, regular exercise 3. Continue monitor BP outside office,  bring readings to next visit, if persistently >140/90 or new symptoms notify office sooner  F/u 6 months, see CKD - referral to Nephrology today       Other Visit Diagnoses    Annual physical exam    -  Primary   Elevated serum creatinine       Relevant Orders   Ambulatory referral to Nephrology      Updated Health Maintenance information - PSA negative, 0.9 check yearly UTD Colonoscopy 2018 Reviewed recent lab results with patient Encouraged improvement to lifestyle with diet and exercise - Goal of weight loss   Orders Placed This Encounter  Procedures  . Ambulatory referral to Nephrology    Referral Priority:   Routine    Referral Type:   Consultation    Referral Reason:   Specialty Services Required    Requested Specialty:   Nephrology    Number of Visits Requested:   1     No orders of the defined types were placed in this encounter.    Follow up plan: Return in about 6 months (around 08/01/2019) for 6 month DM A1c, Foot exam, CKD nephrology.   Patient verbalizes understanding with the above medical recommendations including the limitation of remote medical advice.  Specific follow-up and call-back criteria were given for patient to follow-up or seek medical care more urgently if needed.  Total duration of direct patient care provided via telephone: 30 minutes   AlNobie PutnamDOMiddle Pointroup 02/01/2019, 9:24 AM

## 2019-02-01 NOTE — Assessment & Plan Note (Signed)
Well controlled cholesterol panel on current statin Previous changed from pravastatin to rosuvastatin Last lipid panel 01/2019 Elevated ASCVD risk with prior CVA  Plan: 1. Continue current meds - Rosuvastatin 20mg  daily 2. Continue Plavix 75 + ASA 81 secondary ASCVD risk reduction 3. Encourage improved lifestyle - low carb/cholesterol, reduce portion size, continue improving regular exercise

## 2019-02-01 NOTE — Assessment & Plan Note (Signed)
Controlled DM A1c 6.6 No known hypoglycemia or hyperglycemia at this time. Complications - Nephropathy with CKDIIIb now elevated Creatinine / reduced GFR now, other including hyperlipidemia specifically hypertriglyceridemia, and history of CVA - increases risk of future cardiovascular complications   Plan:  1. Continue current therapy - Metformin 500mg  BID - for now - discussed w/ patient and his daughter that my concern is if his GFR is reduced he may not be appropriate candidate for continuing metformin therapy, he can discuss with Nephrology at upcoming visit after renal panel is re-checked. May consider oral GLP1 or injectable GLP1 in future or DPP4 2. Encourage improved lifestyle - low carb, low sugar diet, reduce portion size, continue improving regular exercise 3. Check CBG, bring log to next visit for review 4. Continue Plavix+ASA, ACEi Statin 5. Advised to schedule DM ophtho exam, send record - should schedule with [facility]  F/u 6 months repeat A1c

## 2019-04-28 ENCOUNTER — Other Ambulatory Visit: Payer: Self-pay | Admitting: Family Medicine

## 2019-04-28 DIAGNOSIS — I10 Essential (primary) hypertension: Secondary | ICD-10-CM

## 2019-06-02 ENCOUNTER — Other Ambulatory Visit: Payer: Self-pay | Admitting: Nephrology

## 2019-06-02 DIAGNOSIS — N1832 Chronic kidney disease, stage 3b: Secondary | ICD-10-CM

## 2019-06-02 DIAGNOSIS — E1122 Type 2 diabetes mellitus with diabetic chronic kidney disease: Secondary | ICD-10-CM

## 2019-06-13 ENCOUNTER — Other Ambulatory Visit: Payer: Self-pay

## 2019-06-13 ENCOUNTER — Other Ambulatory Visit: Payer: Self-pay | Admitting: Family Medicine

## 2019-06-13 DIAGNOSIS — I10 Essential (primary) hypertension: Secondary | ICD-10-CM

## 2019-06-13 DIAGNOSIS — E1169 Type 2 diabetes mellitus with other specified complication: Secondary | ICD-10-CM

## 2019-06-13 DIAGNOSIS — E785 Hyperlipidemia, unspecified: Secondary | ICD-10-CM

## 2019-06-13 DIAGNOSIS — Z8673 Personal history of transient ischemic attack (TIA), and cerebral infarction without residual deficits: Secondary | ICD-10-CM

## 2019-06-13 MED ORDER — ROSUVASTATIN CALCIUM 20 MG PO TABS: 20.0000 mg | ORAL_TABLET | Freq: Every day | ORAL | 0 refills | Status: DC

## 2019-06-13 MED ORDER — METFORMIN HCL 500 MG PO TABS: 500.0000 mg | ORAL_TABLET | Freq: Two times a day (BID) | ORAL | 0 refills | Status: DC

## 2019-06-13 MED ORDER — CLOPIDOGREL BISULFATE 75 MG PO TABS: 75.0000 mg | ORAL_TABLET | Freq: Every day | ORAL | 1 refills | Status: DC

## 2019-06-13 NOTE — Telephone Encounter (Signed)
Copied from [place] [phone]. Topic: Quick Communication - Rx Refill/Question >> Jun 13, 2019  2:22 PM Rainey Pines A wrote: Medication: Patients daughter is requesting all medications be sent to pharmacy.  Has the patient contacted their pharmacy?yes (Agent: If no, request that the patient contact the pharmacy for the refill.) (Agent: If yes, when and what did the pharmacy advise?)contact pcp  Preferred Pharmacy (with phone number or street name):[id number] - [place], [place] - [address]  Phone:  [phone] Fax:  [phone]     Agent: Please be advised that RX refills may take up to 3 business days. We ask that you follow-up with your pharmacy.

## 2019-06-13 NOTE — Telephone Encounter (Signed)
Plavix 75 mg has been provided by historical provider. Please update to Dr. Parks Ranger. Thank you.

## 2019-06-13 NOTE — Telephone Encounter (Signed)
Reviewed provider's office note from last ov 02/01/19. Approving requested refills for Rosuvastatin 20 mg and Metformin hcl 500 mg tabs. Referring plavix request to clinic due to provided last by historical provider.  Requested Prescriptions  Pending Prescriptions Disp Refills   rosuvastatin (CRESTOR) 20 MG tablet 90 tablet 0    Sig: Take 1 tablet (20 mg total) by mouth daily.     Cardiovascular:  Antilipid - Statins Failed - 06/13/2019  2:28 PM      Failed - Valid encounter within last 12 months    Recent Outpatient Visits          4 months ago Annual physical exam   [place], [place]   1 year ago Annual physical exam   [facility] Olin Hauser, DO   2 years ago Annual physical exam   [facility] Olin Hauser, DO   3 years ago Need for immunization against influenza   [facility] Luan Pulling, Ronelle Nigh., MD   3 years ago Diabetes mellitus without complication [facility])   [facility] Arlis Porta., MD             Passed - Total Cholesterol in normal range and within 360 days    Cholesterol  Date Value Ref Range Status  01/28/2019 99 <200 mg/dL Final  10/04/2011 114 0 - 200 mg/dL Final         Passed - LDL in normal range and within 360 days    Ldl Cholesterol, Calc  Date Value Ref Range Status  10/04/2011 41 0 - 100 mg/dL Final   LDL Cholesterol (Calc)  Date Value Ref Range Status  01/28/2019 26 mg/dL (calc) Final    Comment:    Reference range: <100 . Desirable range <100 mg/dL for primary prevention;   <70 mg/dL for patients with CHD or diabetic patients  with > or = 2 CHD risk factors. Marland Kitchen LDL-C is now calculated using the Martin-Hopkins  calculation, which is a validated novel method providing  better accuracy than the Friedewald equation in the  estimation of LDL-C.  Cresenciano Genre et al. Annamaria Helling. 5701;779(39): 2061-2068   (http://education.[employer].com/faq/FAQ164)          Passed - HDL in normal range and within 360 days    HDL Cholesterol  Date Value Ref Range Status  10/04/2011 43 40 - 60 mg/dL Final   HDL  Date Value Ref Range Status  01/28/2019 54 > OR = 40 mg/dL Final         Passed - Triglycerides in normal range and within 360 days    Triglycerides  Date Value Ref Range Status  01/28/2019 107 <150 mg/dL Final  10/04/2011 152 0 - 200 mg/dL Final         Passed - Patient is not pregnant       metFORMIN (GLUCOPHAGE) 500 MG tablet 180 tablet 0    Sig: Take 1 tablet (500 mg total) by mouth 2 (two) times daily with a meal.     Endocrinology:  Diabetes - Biguanides Failed - 06/13/2019  2:28 PM      Failed - Cr in normal range and within 360 days    Creat  Date Value Ref Range Status  01/28/2019 1.96 (H) 0.70 - 1.25 mg/dL Final    Comment:    For patients >74 years of age, the reference limit for Creatinine is approximately 13% higher for people  identified as African-American. .          Failed - eGFR in normal range and within 360 days    GFR, Est African American  Date Value Ref Range Status  01/28/2019 42 (L) > OR = 60 mL/min/1.33m Final   GFR, Est Non African American  Date Value Ref Range Status  01/28/2019 36 (L) > OR = 60 mL/min/1.734mFinal         Failed - Valid encounter within last 6 months    Recent Outpatient Visits          4 months ago [facility]   [facility]   [facility]   3 years ago Need for immunization against influenza   [facility]   [facility]             Passed - HBA1C  is between 0 and 7.9 and within 180 days    Hemoglobin A1C  Date Value Ref Range Status  10/03/2011 6.8 (H) 4.2 - 6.3 % Final    Comment:    The American Diabetes Association recommends that a primary goal of therapy should be <7% and that physicians should reevaluate the treatment regimen in patients with HbA1c values consistently >8%.    Hgb A1c MFr Bld  Date Value Ref Range Status  01/28/2019 6.6 (H) <5.7 % of total Hgb Final    Comment:    For someone without known diabetes, a hemoglobin A1c value of 6.5% or greater indicates that they may have  diabetes and this should be confirmed with a follow-up  test. . For someone with known diabetes, a value <7% indicates  that their diabetes is well controlled and a value  greater than or equal to 7% indicates suboptimal  control. A1c targets should be individualized based on  duration of diabetes, age, comorbid conditions, and  other considerations. . Currently, no consensus exists regarding use of hemoglobin A1c for diagnosis of diabetes for children. .          Refused Prescriptions Disp Refills   clopidogrel (PLAVIX) 75 MG tablet  11    Sig: Take 1 tablet (75 mg total) by mouth daily.     Hematology: Antiplatelets - clopidogrel Failed - 06/13/2019  2:28 PM      Failed - Evaluate AST, ALT within 2 months of therapy initiation.      Failed - HCT in normal range and within 180 days    HCT  Date Value Ref Range Status  01/28/2019 38.3 (L) 38.5 - 50.0 % Final  10/19/2011 41.1 40.0 - 52.0 % Final         Failed - HGB in normal range and within 180 days    Hemoglobin  Date Value Ref Range Status  01/28/2019 12.3 (L) 13.2 - 17.1 g/dL Final   HGB  Date Value Ref Range Status  10/19/2011 13.4 13.0 - 18.0 g/dL Final         Failed - Valid encounter within last 6 months    Recent Outpatient Visits          4 months ago Annual physical exam  [facility], [facility]   1 year ago Annual  physical exam   [facility] Olin Hauser, DO   2 years ago Annual physical exam   [facility] Olin Hauser, DO   3 years ago Need for immunization against influenza   [facility] Luan Pulling, Ronelle Nigh., MD   3 years ago Diabetes mellitus without complication [facility])   [facility] Arlis Porta., MD             Passed - ALT in normal range and within 360 days    ALT  Date Value Ref Range Status  01/28/2019 15 9 - 46 U/L Final   SGPT (ALT)  Date Value Ref Range Status  10/19/2011 23 12 - 78 U/L Final         Passed - AST in normal range and within 360 days    AST  Date Value Ref Range Status  01/28/2019 19 10 - 35 U/L Final   SGOT(AST)  Date Value Ref Range Status  10/19/2011 20 15 - 37 Unit/L Final         Passed - PLT in normal range and within 180 days    Platelets  Date Value Ref Range Status  01/28/2019 208 140 - 400 Thousand/uL Final   Platelet  Date Value Ref Range Status  10/19/2011 251 150 - 440 x10 3/mm 3 Final

## 2019-07-07 ENCOUNTER — Ambulatory Visit: Payer: 59

## 2019-07-19 ENCOUNTER — Ambulatory Visit: Payer: BLUE CROSS/BLUE SHIELD

## 2019-08-24 DIAGNOSIS — L308 Other specified dermatitis: Secondary | ICD-10-CM

## 2019-08-24 MED ORDER — TRIAMCINOLONE ACETONIDE 0.5 % EX CREA: 1.0000 "application " | TOPICAL_CREAM | Freq: Two times a day (BID) | CUTANEOUS | 2 refills | Status: DC

## 2019-10-27 ENCOUNTER — Other Ambulatory Visit: Payer: Self-pay | Admitting: Family Medicine

## 2019-10-27 DIAGNOSIS — E1169 Type 2 diabetes mellitus with other specified complication: Secondary | ICD-10-CM

## 2019-10-27 NOTE — Telephone Encounter (Signed)
Requested medications are due for refill today yes  Requested medications are on the active medication list yes  Last refill 7/23  Last visit Jan 2021  Future visit scheduled no  Notes to clinic Failed protocol of valid visit within 6 months, please assess.

## 2019-11-07 ENCOUNTER — Other Ambulatory Visit: Payer: Self-pay | Admitting: Family Medicine

## 2019-11-07 DIAGNOSIS — E1169 Type 2 diabetes mellitus with other specified complication: Secondary | ICD-10-CM

## 2019-11-07 MED ORDER — ROSUVASTATIN CALCIUM 20 MG PO TABS: 20.0000 mg | ORAL_TABLET | Freq: Every day | ORAL | 0 refills | Status: DC

## 2019-11-07 NOTE — Telephone Encounter (Signed)
Medication Refill - Medication: metFORMIN (GLUCOPHAGE) 500 MG tablet ,rosuvastatin (CRESTOR) 20 MG tablet    Has the patient contacted their pharmacy? yes (Agent: If no, request that the patient contact the pharmacy for the refill.) (Agent: If yes, when and what did the pharmacy advise?Contact PCP)  Preferred Pharmacy (with phone number or street name):  [id number] - [place], [address] Phone:  [phone]  Fax:  [phone]       Agent: Please be advised that RX refills may take up to 3 business days. We ask that you follow-up with your pharmacy.

## 2019-11-07 NOTE — Telephone Encounter (Signed)
Requested medication (s) are due for refill today: no  Requested medication (s) are on the active medication list: yes  Last refill:  10/27/19  # 180 0 refills  Future visit scheduled: No  Notes to clinic:  Called left message for patient to call for appointment using interpreter [place] [phone]. Did not give courtesy refill for this since it has just been refilled.    Requested Prescriptions  Pending Prescriptions Disp Refills   metFORMIN (GLUCOPHAGE) 500 MG tablet 180 tablet 0    Sig: TAKE 1 TABLET BY MOUTH TWICE A DAY WITH A MEAL      Endocrinology:  Diabetes - Biguanides Failed - 11/07/2019 10:33 AM      Failed - Cr in normal range and within 360 days    Creat  Date Value Ref Range Status  01/28/2019 1.96 (H) 0.70 - 1.25 mg/dL Final    Comment:    For patients >6 years of age, the reference limit for Creatinine is approximately 13% higher for people identified as African-American. .           Failed - HBA1C is between 0 and 7.9 and within 180 days    Hemoglobin A1C  Date Value Ref Range Status  10/03/2011 6.8 (H) 4.2 - 6.3 % Final    Comment:    The American Diabetes Association recommends that a primary goal of therapy should be <7% and that physicians should reevaluate the treatment regimen in patients with HbA1c values consistently >8%.    Hgb A1c MFr Bld  Date Value Ref Range Status  01/28/2019 6.6 (H) <5.7 % of total Hgb Final    Comment:    For someone without known diabetes, a hemoglobin A1c value of 6.5% or greater indicates that they may have  diabetes and this should be confirmed with a follow-up  test. . For someone with known diabetes, a value <7% indicates  that their diabetes is well controlled and a value  greater than or equal to 7% indicates suboptimal  control. A1c targets should be individualized based on  duration of diabetes, age, comorbid conditions, and  other considerations. . Currently, no consensus exists regarding use  of hemoglobin A1c for diagnosis of diabetes for children. .           Failed - eGFR in normal range and within 360 days    GFR, Est African American  Date Value Ref Range Status  01/28/2019 42 (L) > OR = 60 mL/min/1.99m Final   GFR, Est Non African American  Date Value Ref Range Status  01/28/2019 36 (L) > OR = 60 mL/min/1.722mFinal          Failed - Valid encounter within last 6 months    Recent Outpatient Visits           9 months ago [facility]   2 [facility]   [facility]   3 years ago Annual physical exam   [facility]   3 years ago Need for immunization against influenza   [facility]   [facility]  [facility]               Signed Prescriptions Disp Refills   rosuvastatin (CRESTOR) 20 MG  tablet 90 tablet 0    Sig: Take 1 tablet (20 mg total) by mouth daily.      Cardiovascular:  Antilipid - Statins Passed - 11/07/2019 10:33 AM      Passed - Total Cholesterol in normal range and within 360 days    Cholesterol  Date Value Ref Range Status  01/28/2019 99 <200 mg/dL Final  10/04/2011 114 0 - 200 mg/dL Final          Passed - LDL in normal range and within 360 days    Ldl Cholesterol, Calc  Date Value Ref Range Status  10/04/2011 41 0 - 100 mg/dL Final   LDL Cholesterol (Calc)  Date Value Ref Range Status  01/28/2019 26 mg/dL (calc) Final    Comment:    Reference range: <100 . Desirable range <100 mg/dL for primary prevention;   <70 mg/dL for patients with CHD or diabetic patients  with > or = 2 CHD risk factors. Marland Kitchen LDL-C is now calculated using the Martin-Hopkins  calculation, which is a validated novel method providing  better accuracy than  the Friedewald equation in the  estimation of LDL-C.  Cresenciano Genre et al. Annamaria Helling. 9396;886(48): 2061-2068  (http://education.[facility].com/faq/FAQ164)           Passed - HDL in normal range and within 360 days    HDL Cholesterol  Date Value Ref Range Status  10/04/2011 43 40 - 60 mg/dL Final   HDL  Date Value Ref Range Status  01/28/2019 54 > OR = 40 mg/dL Final          Passed - Triglycerides in normal range and within 360 days    Triglycerides  Date Value Ref Range Status  01/28/2019 107 <150 mg/dL Final  10/04/2011 152 0 - 200 mg/dL Final          Passed - Patient is not pregnant      Passed - Valid encounter within last 12 months    Recent Outpatient Visits           9 months ago Annual physical exam   [place], DO   2 years ago Annual physical exam   [facility] Olin Hauser, DO   3 years ago Annual physical exam   [facility]lin [facility], DO   3 years ago Need for immunization against influenza   [facility] Arlis Porta., MD   4 years ago Diabetes mellitus without complication [facility])   [facility] Arlis Porta., MD

## 2019-11-07 NOTE — Telephone Encounter (Signed)
Requested Prescriptions  Pending Prescriptions Disp Refills  . metFORMIN (GLUCOPHAGE) 500 MG tablet 180 tablet 0    Sig: TAKE 1 TABLET BY MOUTH TWICE A DAY WITH A MEAL     Endocrinology:  Diabetes - Biguanides Failed - 11/07/2019 10:33 AM      Failed - Cr in normal range and within 360 days    Creat  Date Value Ref Range Status  01/28/2019 1.96 (H) 0.70 - 1.25 mg/dL Final    Comment:    For patients >60 years of age, the reference limit for Creatinine is approximately 13% higher for people identified as African-American. .          Failed - HBA1C is between 0 and 7.9 and within 180 days    Hemoglobin A1C  Date Value Ref Range Status  10/03/2011 6.8 (H) 4.2 - 6.3 % Final    Comment:    The American Diabetes Association recommends that a primary goal of therapy should be <7% and that physicians should reevaluate the treatment regimen in patients with HbA1c values consistently >8%.    Hgb A1c MFr Bld  Date Value Ref Range Status  01/28/2019 6.6 (H) <5.7 % of total Hgb Final    Comment:    For someone without known diabetes, a hemoglobin A1c value of 6.5% or greater indicates that they may have  diabetes and this should be confirmed with a follow-up  test. . For someone with known diabetes, a value <7% indicates  that their diabetes is well controlled and a value  greater than or equal to 7% indicates suboptimal  control. A1c targets should be individualized based on  duration of diabetes, age, comorbid conditions, and  other considerations. . Currently, no consensus exists regarding use of hemoglobin A1c for diagnosis of diabetes for children. .          Failed - eGFR in normal range and within 360 days    GFR, Est African American  Date Value Ref Range Status  01/28/2019 42 (L) > OR = 60 mL/min/1.43m Final   GFR, Est Non African American  Date Value Ref Range Status  01/28/2019 36 (L) > OR = 60 mL/min/1.772mFinal         Failed - Valid encounter within last  6 months    Recent Outpatient Visits          9 months ago [facility]   [facility]   2 years [facility] Annual physical exam   [facility]   3 years ago Annual physical exam   [facility]   3 years ago Need for immunization against influenza   [facility]   4 [facility]  [facility]             . rosuvastatin (CRESTOR) 20 MG tablet 90 tablet 0    Sig: Take 1 tablet (20 mg total) by mouth daily.     Cardiovascular:  Antilipid - Statins Passed - 11/07/2019 10:33 AM      Passed - Total Cholesterol in normal range and within 360 days    Cholesterol  Date Value Ref Range Status  01/28/2019 99 <200 mg/dL Final  10/04/2011 114 0 - 200 mg/dL Final         Passed -  LDL in normal range and within 360 days    Ldl Cholesterol, Calc  Date Value Ref Range Status  10/04/2011 41 0 - 100 mg/dL Final   LDL Cholesterol (Calc)  Date Value Ref Range Status  01/28/2019 26 mg/dL (calc) Final    Comment:    Reference range: <100 . Desirable range <100 mg/dL for primary prevention;   <70 mg/dL for patients with CHD or diabetic patients  with > or = 2 CHD risk factors. Marland Kitchen LDL-C is now calculated using the Martin-Hopkins  calculation, which is a validated novel method providing  better accuracy than the Friedewald equation in the  estimation of LDL-C.  Cresenciano Genre et al. Annamaria Helling. 9924;268(34): 2061-2068  (http://education.[facility].com/faq/FAQ164)          Passed - HDL in normal range and within 360 days    HDL Cholesterol  Date Value Ref Range Status  10/04/2011 43 40 - 60 mg/dL Final   HDL  Date Value Ref Range Status  01/28/2019 54 > OR = 40 mg/dL Final         Passed - Triglycerides in normal range and within 360  days    Triglycerides  Date Value Ref Range Status  01/28/2019 107 <150 mg/dL Final  10/04/2011 152 0 - 200 mg/dL Final         Passed - Patient is not pregnant      Passed - Valid encounter within last 12 months    Recent Outpatient Visits          9 months ago Annual physical exam   [place], DO   2 years ago Annual physical exam   [facility] DO   3 years ago Annual physical exam   [facility]er, DO   3 years ago Need for immunization against influenza   [facility] Arlis Porta., MD   4 years ago Diabetes mellitus without complication [facility])   [facility] Arlis Porta., MD             Called left message for patient to call office for appointment using interpreter [place] [phone]

## 2019-11-11 NOTE — Telephone Encounter (Signed)
Copied from [place] [id number]. Topic: General - Inquiry >> Nov 10, 2019  3:09 PM Gillis Ends D wrote: Reason for CRM: Patient's daughter called and stated that she has some questions about the coding of a lab that her dad received on January 22,2021. She would like a return call to [phone]. Please advise

## 2019-12-10 ENCOUNTER — Other Ambulatory Visit: Payer: Self-pay | Admitting: Family Medicine

## 2019-12-10 DIAGNOSIS — Z8673 Personal history of transient ischemic attack (TIA), and cerebral infarction without residual deficits: Secondary | ICD-10-CM

## 2019-12-10 NOTE — Telephone Encounter (Signed)
Requested medications are due for refill today yes  Requested medications are on the active medication list yes  Last refill 9/3  Last visit 01/2019  Future visit scheduled no  Notes to clinic Failed protocol due to no valid visit within 6  months.

## 2019-12-13 ENCOUNTER — Other Ambulatory Visit: Payer: Self-pay | Admitting: Family Medicine

## 2019-12-13 ENCOUNTER — Other Ambulatory Visit: Payer: Self-pay

## 2019-12-13 DIAGNOSIS — Z8673 Personal history of transient ischemic attack (TIA), and cerebral infarction without residual deficits: Secondary | ICD-10-CM

## 2019-12-13 DIAGNOSIS — I10 Essential (primary) hypertension: Secondary | ICD-10-CM

## 2019-12-13 DIAGNOSIS — E1169 Type 2 diabetes mellitus with other specified complication: Secondary | ICD-10-CM

## 2019-12-13 DIAGNOSIS — E119 Type 2 diabetes mellitus without complications: Secondary | ICD-10-CM

## 2019-12-13 MED ORDER — CARVEDILOL 3.125 MG PO TABS: ORAL_TABLET | ORAL | 0 refills | Status: DC

## 2019-12-13 NOTE — Telephone Encounter (Signed)
carvedilol (COREG) 3.125 MG tablet Medication Date: 04/28/2019 Department: [facility] Ordering/Authorizing: Olin Hauser, DO   [id number] - [place], [address] Phone:  [phone]  Fax:  [phone]

## 2019-12-13 NOTE — Addendum Note (Signed)
Addended by: Mliss Sax on: 12/13/2019 03:02 PM   Modules accepted: Orders

## 2019-12-13 NOTE — Telephone Encounter (Signed)
Future visit scheduled for 12/23/19 courtesy refill given.

## 2019-12-15 MED ORDER — ROSUVASTATIN CALCIUM 20 MG PO TABS: 20.0000 mg | ORAL_TABLET | Freq: Every day | ORAL | 0 refills | Status: DC

## 2019-12-23 ENCOUNTER — Encounter: Payer: Self-pay | Admitting: Family Medicine

## 2019-12-23 ENCOUNTER — Other Ambulatory Visit: Payer: Self-pay

## 2019-12-23 ENCOUNTER — Ambulatory Visit (INDEPENDENT_AMBULATORY_CARE_PROVIDER_SITE_OTHER): Payer: 59 | Admitting: Family Medicine

## 2019-12-23 VITALS — BP 117/83 | HR 80 | Temp 97.5°F | Resp 16 | Ht 70.0 in | Wt 139.0 lb

## 2019-12-23 DIAGNOSIS — I10 Essential (primary) hypertension: Secondary | ICD-10-CM

## 2019-12-23 DIAGNOSIS — Z0000 Encounter for general adult medical examination without abnormal findings: Secondary | ICD-10-CM

## 2019-12-23 DIAGNOSIS — E041 Nontoxic single thyroid nodule: Secondary | ICD-10-CM

## 2019-12-23 DIAGNOSIS — Z23 Encounter for immunization: Secondary | ICD-10-CM | POA: Diagnosis not present

## 2019-12-23 DIAGNOSIS — Z8673 Personal history of transient ischemic attack (TIA), and cerebral infarction without residual deficits: Secondary | ICD-10-CM

## 2019-12-23 DIAGNOSIS — N1832 Chronic kidney disease, stage 3b: Secondary | ICD-10-CM | POA: Diagnosis not present

## 2019-12-23 DIAGNOSIS — E1169 Type 2 diabetes mellitus with other specified complication: Secondary | ICD-10-CM | POA: Diagnosis not present

## 2019-12-23 DIAGNOSIS — N183 Chronic kidney disease, stage 3 unspecified: Secondary | ICD-10-CM

## 2019-12-23 DIAGNOSIS — E1121 Type 2 diabetes mellitus with diabetic nephropathy: Secondary | ICD-10-CM

## 2019-12-23 DIAGNOSIS — I129 Hypertensive chronic kidney disease with stage 1 through stage 4 chronic kidney disease, or unspecified chronic kidney disease: Secondary | ICD-10-CM

## 2019-12-23 DIAGNOSIS — E785 Hyperlipidemia, unspecified: Secondary | ICD-10-CM

## 2019-12-23 MED ORDER — ROSUVASTATIN CALCIUM 20 MG PO TABS: 20.0000 mg | ORAL_TABLET | Freq: Every day | ORAL | 3 refills | Status: DC

## 2019-12-23 MED ORDER — CARVEDILOL 3.125 MG PO TABS: ORAL_TABLET | ORAL | 3 refills | Status: DC

## 2019-12-23 MED ORDER — METFORMIN HCL 500 MG PO TABS: 500.0000 mg | ORAL_TABLET | Freq: Two times a day (BID) | ORAL | 3 refills | Status: DC

## 2019-12-23 MED ORDER — CLOPIDOGREL BISULFATE 75 MG PO TABS: 75.0000 mg | ORAL_TABLET | Freq: Every day | ORAL | 3 refills | Status: DC

## 2019-12-23 NOTE — Assessment & Plan Note (Signed)
Stable clinically without residual deficits from prior CVA Has some baseline slightly asymmetrical mouth possibly residual, without change History CVA 2013 Off Aggrenox due to cost  Plan Continue yearly f/u with [facility] On Plavix 75mg and ASA 81mg DAPT per Neurology recommendations Continue Pravastatin for ASCVD risk reduction 

## 2019-12-23 NOTE — Assessment & Plan Note (Signed)
Check TSH °Asymptomatic °

## 2019-12-23 NOTE — Assessment & Plan Note (Signed)
Well controlled cholesterol panel on current statin Previous changed from pravastatin to rosuvastatin Last lipid panel 01/2019 - due TODAY for fasting lipid Elevated ASCVD risk with prior CVA  Plan: 1. Continue current meds - Rosuvastatin 20mg  daily refilled 2. Continue Plavix 75 + ASA 81 secondary ASCVD risk reduction 3. Encourage improved lifestyle - low carb/cholesterol, reduce portion size, continue improving regular exercise

## 2019-12-23 NOTE — Assessment & Plan Note (Signed)
Followed by Nephrology [facility] now Concern with progression of CKD III to stage IIIb now Previous Elevated creatinine from prior 1.15 to 1.5 now up to 1.96 with GFR reduced to 36  Plan - Improve hydration as discussed - Limit NSAIDs Continue back on ACEi 2.5 low dose - Continue Metformin 500mg  BID low dose with GFR 36 but can defer to Nephrology to determine if this is still appropriate or if we can consider alternative therapy such as GLP vs DPP4

## 2019-12-23 NOTE — Assessment & Plan Note (Signed)
Well-controlled HTN - Home BP readings normal  History of CVA Complication with MHW-8G advancing w/ elevated Cr / reduced GFR - uncertain if acute on chronic or chronic progression  Followed by Nephrology   Plan:  1 Continue current BP regimen back on Lisinopril 2.5mg  daily, and Carvedilol 3.125mg  HALF tab BID (dose =1.57) 2. Encourage improved lifestyle - low sodium diet, regular exercise 3. Continue monitor BP outside office, bring readings to next visit, if persistently >140/90 or new symptoms notify office sooner

## 2019-12-23 NOTE — Assessment & Plan Note (Signed)
Controlled DM A1c 6.6 previous, now due for A1c No known hypoglycemia or hyperglycemia at this time. Complications - Nephropathy with CKDIIIb now elevated Creatinine / reduced GFR now, other including hyperlipidemia specifically hypertriglyceridemia, and history of CVA - increases risk of future cardiovascular complications   Plan:  1. Continue current therapy - Metformin 500mg  BID - Future May consider oral GLP1 or injectable GLP1 in future or DPP4 2. Encourage improved lifestyle - low carb, low sugar diet, reduce portion size, continue improving regular exercise 3. Check CBG, bring log to next visit for review 4. Continue Plavix+ASA, ACEi Statin 5. Advised to schedule DM ophtho exam, send record - should schedule with [facility] Foot exam today  F/u 6 months repeat A1c

## 2019-12-23 NOTE — Progress Notes (Signed)
Subjective:    Patient ID: [name], male    DOB: 13-Oct-1960, 59 y.o.   MRN: [id number]  [name] is a 59 y.o. male presenting on 12/23/2019 for Annual Exam   HPI  History provided by patient in [place] and interpretation on phone by Vertell Limber [phone]  Here for Annual Physical and Fasting lab only check today.   CHRONIC DM, Type 2 w/ CKD-3b Reports no concerns.Last A1c 6.6 (2021). Lab showed elevated Creatinine to 1.96 and reduced eGFR 36, compared to past 2018-2019 1.15 to 1.57 range. However similar to 1.96 in 2013. CBGs: Avg100-120s, [place] < 100, High< 150. Checks CBGsx 1 daily Meds:Metformin 539m BID Reports good compliance. Tolerating well w/o side-effects Currentlyon ACEi on Lisinopril2.532mdaily Taking Statin and on anti-platelet Lifestyle: - Regarding hydration, he says drinks 1-2 cups water a day, not hydrated well. - Diet (Improved diet overall, tries to maintain balanced diet, tries to limit sugars) - Exercise (Stays active working at [employer]but does not do regular exercise) - [facility] 1 year ago - he is due to schedule Annual DM Eye Visit Denies hypoglycemia  CHRONIC HTN:  Reportshe checks BP at home, 120/80s on average. Mostly unchanged since previous visit. See above CKD-III [facility] LaHolley Raringurrent Meds -Carvedilol 3.12545mALF tab BID, Lisinopril 2.5mg47mily Reports good compliance, took meds today. Tolerating well, w/o complaints.  HYPERLIPIDEMIA: - Reports no concerns. Last lipid panel1/2021very well controlled, low LDL 26, normal HDL 54 and low TG 107, previously had high TG. - Currently taking Rosuvastatin 20mg6mly, tolerating well without side effects or myalgias  History of CVA, withoutsignificantresidual deficit - Reported prior history of CVA in 2013, has done well since without any significant residual deficits, he does have history of slight facial asymmetry  but no functional deficit with weakness or sensation change. Previously taking Aggrenox for stroke risk reduction. - [facility] switched off Aggrenox and started on Plavix 75mg 6my and Aspirin 81mg d62m Today he continues to do well on combination of Plavix 75mg da49mand ASA 81mg dai14mHistory of thyroid nodule Normal TSH lab. Due today  Health Maintenance:  UTD COVID Vaccine. X 2 doses  Due for Flu Shot, will receive today   Colon CA Screening: LastColon cancer screening with Cologuard 07/24/16 POSITIVE, then referred forColonoscopy (done by Dr Wohl AGI)Allen Norrissults withx 1 polyp hyperplastic benign, good for10years, done 12/02/16 - next due in 2028. Currently asymptomatic. No known family history of colon CA.  - Prostate CA Screening: No known personalor family history of prostate CA. Prior PSA reported normal. Last PSA 0.9 (01/2019). Asymptomatic prostate  - UTD routine Hepatitis C screen was negative 07/2016. Declines HIV - UTD Pneumovax-23, in DM, now next due age 14 for Pr53nar-13   Depression screen PHQ 2/9 [facility]7/2021 02/01/2019 07/28/2017  Decreased Interest 0 0 0  Down, Depressed, Hopeless 0 0 0  PHQ - 2 Score 0 0 0    Past Medical History:  Diagnosis Date  . Cyst of thyroid 2013   right  . Hyperlipidemia   . Kidney stones   . [facility]:  Procedure Laterality Date  . APPENDECTOMY  2003  . COLONOSCOPY WITH PROPOFOL N/A 12/02/2016   Procedure: COLONOSCOPY WITH PROPOFOL;  Surgeon: Wohl, DarLucilla [facility]ame[facility];  Service: Endoscopy;  Laterality: N/A;  . FINE NEEDLE ASPIRATION Right 09-28-13   thyroid nodule/BENIGN FOLLICULAR CELLS, COLLOID  Social History   Socioeconomic History  . Marital status: Married    Spouse name: Not on file  . Number of children: Not on file  . Years of education: Not on file  . Highest education level: Not on file  Occupational History  . Not on file  Tobacco Use  .  Smoking status: Never Smoker  . Smokeless tobacco: Never Used  Substance and Sexual Activity  . Alcohol use: No  . Drug use: No  . Sexual activity: Not on file  Other Topics Concern  . Not on file  Social History Narrative  . Not on file   Social Determinants of Health   Financial Resource Strain: Not on file  Food Insecurity: Not on file  Transportation Needs: Not on file  Physical Activity: Not on file  Stress: Not on file  Social Connections: Not on file  Intimate Partner Violence: Not on file   Family History  Family history unknown: Yes   Current Outpatient Medications on File Prior to Visit  Medication Sig  . BAYER ASPIRIN PO Take 81 mg by mouth.  Marland Kitchen lisinopril (ZESTRIL) 2.5 MG tablet TAKE 1 TABLET BY MOUTH EVERY DAY  . Multiple Vitamin (MULTI-VITAMINS) TABS Take by mouth.  . triamcinolone cream (KENALOG) 0.5 % Apply 1 application topically 2 (two) times daily. To affected areas, for up to 2 weeks.   No current facility-administered medications on file prior to visit.    Review of Systems  Constitutional: Negative for activity change, appetite change, chills, diaphoresis, fatigue and fever.  HENT: Negative for congestion and hearing loss.   Eyes: Negative for visual disturbance.  Respiratory: Negative for cough, chest tightness, shortness of breath and wheezing.   Cardiovascular: Negative for chest pain, palpitations and leg swelling.  Gastrointestinal: Negative for abdominal pain, constipation, diarrhea, nausea and vomiting.  Endocrine: Negative for cold intolerance.  Genitourinary: Negative for dysuria, frequency and hematuria.  Musculoskeletal: Negative for arthralgias and neck pain.  Skin: Negative for rash.  Allergic/Immunologic: Negative for environmental allergies.  Neurological: Negative for dizziness, weakness, light-headedness, numbness and headaches.  Hematological: Negative for adenopathy.  Psychiatric/Behavioral: Negative for behavioral problems,  dysphoric mood and sleep disturbance.   Per HPI unless specifically indicated above      Objective:    BP 117/83   Pulse 80   Temp (!) 97.5 F (36.4 C) (Temporal)   Resp 16   Ht 5' 10"  (1.778 m)   Wt 139 lb (63 kg)   SpO2 100%   BMI 19.94 kg/m   Wt Readings from Last 3 Encounters:  12/23/19 139 lb (63 kg)  02/01/19 145 lb (65.8 kg)  07/28/17 141 lb 9.6 oz (64.2 kg)    Physical Exam Vitals and nursing note reviewed.  Constitutional:      General: He is not in acute distress.    Appearance: He is well-developed and well-nourished. He is not diaphoretic.     Comments: Well-appearing, comfortable, cooperative  HENT:     Head: Normocephalic and atraumatic.     Mouth/Throat:     Mouth: Oropharynx is clear and moist.  Eyes:     General:        Right eye: No discharge.        Left eye: No discharge.     Extraocular Movements: EOM normal.     Conjunctiva/sclera: Conjunctivae normal.     Pupils: Pupils are equal, round, and reactive to light.  Neck:     Thyroid: No thyromegaly.  Vascular: No carotid bruit.  Cardiovascular:     Rate and Rhythm: Normal rate and regular rhythm.     Pulses: Intact distal pulses.     Heart sounds: Normal heart sounds. No murmur heard.   Pulmonary:     Effort: Pulmonary effort is normal. No respiratory distress.     Breath sounds: Normal breath sounds. No wheezing or rales.  Abdominal:     General: Bowel sounds are normal. There is no distension.     Palpations: Abdomen is soft. There is no mass.     Tenderness: There is no abdominal tenderness.  Musculoskeletal:        General: No tenderness or edema. Normal range of motion.     Cervical back: Normal range of motion and neck supple.     Right lower leg: No edema.     Left lower leg: No edema.     Comments: Upper / Lower Extremities: - Normal muscle tone, strength bilateral upper extremities 5/5, lower extremities 5/5  Lymphadenopathy:     Cervical: No cervical adenopathy.  Skin:     General: Skin is warm and dry.     Findings: No erythema or rash.  Neurological:     Mental Status: He is alert and oriented to person, place, and time.     Comments: Distal sensation intact to light touch all extremities  Psychiatric:        Mood and Affect: Mood and affect normal.        Behavior: Behavior normal.     Comments: Well groomed, good eye contact, normal speech and thoughts      Diabetic Foot Exam - Simple   Simple Foot Form Diabetic Foot exam was performed with the following findings: Yes 12/23/2019 10:06 AM  Visual Inspection No deformities, no ulcerations, no other skin breakdown bilaterally: Yes Sensation Testing Intact to touch and monofilament testing bilaterally: Yes Pulse Check Posterior Tibialis and Dorsalis pulse intact bilaterally: Yes Comments    Recent Labs    01/28/19 1029  HGBA1C 6.6*    Results for orders placed or performed in visit on 01/28/19  TSH  Result Value Ref Range   TSH 1.34 0.40 - 4.50 mIU/L  PSA  Result Value Ref Range   PSA 0.9 < OR = 4.0 ng/mL  Lipid panel  Result Value Ref Range   Cholesterol 99 <200 mg/dL   HDL 54 > OR = 40 mg/dL   Triglycerides 107 <150 mg/dL   LDL Cholesterol (Calc) 26 mg/dL (calc)   Total CHOL/HDL Ratio 1.8 <5.0 (calc)   Non-HDL Cholesterol (Calc) 45 <130 mg/dL (calc)  COMPLETE METABOLIC PANEL WITH GFR  Result Value Ref Range   Glucose, Bld 125 (H) 65 - 99 mg/dL   BUN 35 (H) 7 - 25 mg/dL   Creat 1.96 (H) 0.70 - 1.25 mg/dL   GFR, Est Non African American 36 (L) > OR = 60 mL/min/1.66m   GFR, Est African American 42 (L) > OR = 60 mL/min/1.78m  BUN/Creatinine Ratio 18 6 - 22 (calc)   Sodium 140 135 - 146 mmol/L   Potassium 4.2 3.5 - 5.3 mmol/L   Chloride 108 98 - 110 mmol/L   CO2 27 20 - 32 mmol/L   Calcium 9.8 8.6 - 10.3 mg/dL   Total Protein 7.0 6.1 - 8.1 g/dL   Albumin 4.2 3.6 - 5.1 g/dL   Globulin 2.8 1.9 - 3.7 g/dL (calc)   AG Ratio 1.5 1.0 - 2.5 (calc)  Total Bilirubin 0.7 0.2 - 1.2  mg/dL   Alkaline phosphatase (APISO) 43 35 - 144 U/L   AST 19 10 - 35 U/L   ALT 15 9 - 46 U/L  CBC with Differential/Platelet  Result Value Ref Range   WBC 6.2 3.8 - 10.8 Thousand/uL   RBC 4.16 (L) 4.20 - 5.80 Million/uL   Hemoglobin 12.3 (L) 13.2 - 17.1 g/dL   HCT 38.3 (L) 38.5 - 50.0 %   MCV 92.1 80.0 - 100.0 fL   MCH 29.6 27.0 - 33.0 pg   MCHC 32.1 32.0 - 36.0 g/dL   RDW 13.0 11.0 - 15.0 %   Platelets 208 140 - 400 Thousand/uL   MPV 11.4 7.5 - 12.5 fL   Neutro Abs 3,261 1,500 - 7,800 cells/uL   Lymphs Abs 2,127 850 - 3,900 cells/uL   Absolute Monocytes 527 200 - 950 cells/uL   Eosinophils Absolute 217 15 - 500 cells/uL   Basophils Absolute 68 0 - 200 cells/uL   Neutrophils Relative % 52.6 %   Total Lymphocyte 34.3 %   Monocytes Relative 8.5 %   Eosinophils Relative 3.5 %   Basophils Relative 1.1 %  Hemoglobin A1c  Result Value Ref Range   Hgb A1c MFr Bld 6.6 (H) <5.7 % of total Hgb   Mean Plasma Glucose 143 (calc)   eAG (mmol/L) 7.9 (calc)      Assessment & Plan:   Problem List Items Addressed This Visit    Nontoxic uninodular goiter    Check TSH Asymptomatic      Relevant Medications   carvedilol (COREG) 3.125 MG tablet   Other Relevant Orders   TSH   Hyperlipidemia associated with type 2 diabetes mellitus (HCC)    Well controlled cholesterol panel on current statin Previous changed from pravastatin to rosuvastatin Last lipid panel 01/2019 - due TODAY for fasting lipid Elevated ASCVD risk with prior CVA  Plan: 1. Continue current meds - Rosuvastatin 56m daily refilled 2. Continue Plavix 75 + ASA 81 secondary ASCVD risk reduction 3. Encourage improved lifestyle - low carb/cholesterol, reduce portion size, continue improving regular exercise        Relevant Medications   metFORMIN (GLUCOPHAGE) 500 MG tablet   rosuvastatin (CRESTOR) 20 MG tablet   Other Relevant Orders   Lipid panel   TSH   History of CVA (cerebrovascular accident)    Stable  clinically without residual deficits from prior CVA Has some baseline slightly asymmetrical mouth possibly residual, without change History CVA 2013 Off Aggrenox due to cost  Plan [facility] Plavix 755mand ASA 817mAPT per Neurology recommendations Continue Pravastatin for ASCVD risk reduction      Relevant Medications   clopidogrel (PLAVIX) 75 MG tablet   Controlled type 2 diabetes mellitus with diabetic nephropathy, without long-term current use of insulin (HCC)    Controlled DM A1c 6.6 previous, now due for A1c No known hypoglycemia or hyperglycemia at this time. Complications - Nephropathy with CKDIIIb now elevated Creatinine / reduced GFR now, other including hyperlipidemia specifically hypertriglyceridemia, and history of CVA - increases risk of future cardiovascular complications   Plan:  1. Continue current therapy - Metformin 500m16mD - Future May consider oral GLP1 or injectable GLP1 in future or DPP4 2. Encourage improved lifestyle - low carb, low sugar diet, reduce portion size, continue improving regular exercise 3. Check CBG, bring log to next visit for review 4. Continue Plavix+ASA, ACEi Statin 5. Advised to schedule  DM ophtho exam, send record - should schedule with [facility] DM Foot exam today  F/u 6 months repeat A1c      Relevant Medications   metFORMIN (GLUCOPHAGE) 500 MG tablet   rosuvastatin (CRESTOR) 20 MG tablet   Other Relevant Orders   Hemoglobin A1c   CKD (chronic kidney disease), stage III ([place])    Followed by Nephrology [facility] now Concern with progression of CKD III to stage IIIb now Previous Elevated creatinine from prior 1.15 to 1.5 now up to 1.96 with GFR reduced to 36  Plan - Improve hydration as discussed - Limit NSAIDs Continue back on ACEi 2.5 low dose - Continue Metformin 568m BID low dose with GFR 36 but can defer to Nephrology to determine if this is still appropriate or if we can consider  alternative therapy such as GLP vs DPP4      Relevant Orders   CBC with Differential/Platelet   COMPLETE METABOLIC PANEL WITH GFR   Benign hypertension with CKD (chronic kidney disease) stage III (HCC)    Well-controlled HTN - Home BP readings normal  History of CVA Complication with CZRA-0Tadvancing w/ elevated Cr / reduced GFR - uncertain if acute on chronic or chronic progression  Followed by Nephrology   Plan:  1 Continue current BP regimen back on Lisinopril 2.560mdaily, and Carvedilol 3.12564mALF tab BID (dose =1.57) 2. Encourage improved lifestyle - low sodium diet, regular exercise 3. Continue monitor BP outside office, bring readings to next visit, if persistently >140/90 or new symptoms notify office sooner      Relevant Medications   carvedilol (COREG) 3.125 MG tablet   rosuvastatin (CRESTOR) 20 MG tablet    Other Visit Diagnoses    Annual physical exam    -  Primary   Relevant Orders   Hemoglobin A1c   CBC with Differential/Platelet   COMPLETE METABOLIC PANEL WITH GFR   Lipid panel   PSA   Essential hypertension       Relevant Medications   carvedilol (COREG) 3.125 MG tablet   rosuvastatin (CRESTOR) 20 MG tablet   Other Relevant Orders   COMPLETE METABOLIC PANEL WITH GFR   Needs flu shot       Relevant Orders   Flu Vaccine QUAD 36+ mos IM (Completed)      Updated Health Maintenance information Flu shot today Due for COVID Booster Reviewed recent lab results with patient Encouraged improvement to lifestyle with diet and exercise - Goal of weight loss   Meds ordered this encounter  Medications  . clopidogrel (PLAVIX) 75 MG tablet    Sig: Take 1 tablet (75 mg total) by mouth daily.    Dispense:  90 tablet    Refill:  3    DX Code Needed  Z86.73  . carvedilol (COREG) 3.125 MG tablet    Sig: TAKE 1/2 TABLET BY MOUTH TWICE A DAY    Dispense:  90 tablet    Refill:  3  . metFORMIN (GLUCOPHAGE) 500 MG tablet    Sig: Take 1 tablet (500 mg total) by  mouth 2 (two) times daily with a meal.    Dispense:  180 tablet    Refill:  3  . rosuvastatin (CRESTOR) 20 MG tablet    Sig: Take 1 tablet (20 mg total) by mouth daily.    Dispense:  90 tablet    Refill:  3      Follow up plan: Return in about 6 months (around 06/22/2020) for 6 month  follow-up DM A1c, HTN - phone interpreter  [place].  Nobie Putnam, [facility] 12/23/2019, 9:53 AM

## 2019-12-23 NOTE — Patient Instructions (Addendum)
Thank you for coming to the office today.  Blood work today, stay tuned for results on mychart  Please attach a Photo of your COVID Vaccine card so we can see the information on it to your [facility].  Flu shot today  Refilled all medications for 1 year.   Please schedule a Follow-up Appointment to: Return in about 6 months (around 06/22/2020) for 6 month follow-up DM A1c, HTN - phone interpreter  [place]onese.  If you have any other questions or concerns, please feel free to call the office or send a message through [place]. You may also schedule an earlier appointment if necessary.  Additionally, you may be receiving a survey about your experience at our office within a few days to 1 week by e-mail or mail. We value your feedback.  Nobie Putnam, DO [place]

## 2019-12-24 LAB — COMPLETE METABOLIC PANEL WITH GFR
AG Ratio: 1.5 (calc) (ref 1.0–2.5)
ALT: 25 U/L (ref 9–46)
AST: 18 U/L (ref 10–35)
Albumin: 4.4 g/dL (ref 3.6–5.1)
Alkaline phosphatase (APISO): 63 U/L (ref 35–144)
BUN/Creatinine Ratio: 18 (calc) (ref 6–22)
BUN: 29 mg/dL — ABNORMAL HIGH (ref 7–25)
CO2: 30 mmol/L (ref 20–32)
Calcium: 9.6 mg/dL (ref 8.6–10.3)
Chloride: 104 mmol/L (ref 98–110)
Creat: 1.62 mg/dL — ABNORMAL HIGH (ref 0.70–1.25)
GFR, Est African American: 53 mL/min/{1.73_m2} — ABNORMAL LOW (ref >=60)
GFR, Est Non African American: 45 mL/min/{1.73_m2} — ABNORMAL LOW (ref >=60)
Globulin: 2.9 g/dL (calc) (ref 1.9–3.7)
Glucose, Bld: 137 mg/dL — ABNORMAL HIGH (ref 65–99)
Potassium: 4.3 mmol/L (ref 3.5–5.3)
Sodium: 140 mmol/L (ref 135–146)
Total Bilirubin: 0.9 mg/dL (ref 0.2–1.2)
Total Protein: 7.3 g/dL (ref 6.1–8.1)

## 2019-12-24 LAB — CBC WITH DIFFERENTIAL/PLATELET
Absolute Monocytes: 462 cells/uL (ref 200–950)
Basophils Absolute: 88 cells/uL (ref 0–200)
Basophils Relative: 1.6 %
Eosinophils Absolute: 182 cells/uL (ref 15–500)
Eosinophils Relative: 3.3 %
HCT: 38.8 % (ref 38.5–50.0)
Hemoglobin: 12.5 g/dL — ABNORMAL LOW (ref 13.2–17.1)
Lymphs Abs: 1650 cells/uL (ref 850–3900)
MCH: 29.8 pg (ref 27.0–33.0)
MCHC: 32.2 g/dL (ref 32.0–36.0)
MCV: 92.4 fL (ref 80.0–100.0)
MPV: 11.3 fL (ref 7.5–12.5)
Monocytes Relative: 8.4 %
Neutro Abs: 3119 cells/uL (ref 1500–7800)
Neutrophils Relative %: 56.7 %
Platelets: 200 10*3/uL (ref 140–400)
RBC: 4.2 10*6/uL (ref 4.20–5.80)
RDW: 12.8 % (ref 11.0–15.0)
Total Lymphocyte: 30 %
WBC: 5.5 10*3/uL (ref 3.8–10.8)

## 2019-12-24 LAB — LIPID PANEL
Cholesterol: 101 mg/dL (ref <200)
HDL: 52 mg/dL (ref >=40)
LDL Cholesterol (Calc): 31 mg/dL (calc)
Non-HDL Cholesterol (Calc): 49 mg/dL (calc) (ref <130)
Total CHOL/HDL Ratio: 1.9 (calc) (ref <5.0)
Triglycerides: 99 mg/dL (ref <150)

## 2019-12-24 LAB — HEMOGLOBIN A1C
Hgb A1c MFr Bld: 6.7 % of total Hgb — ABNORMAL HIGH (ref <5.7)
Mean Plasma Glucose: 146 mg/dL
eAG (mmol/L): 8.1 mmol/L

## 2019-12-24 LAB — PSA: PSA: 0.79 ng/mL (ref <=4.0)

## 2019-12-24 LAB — TSH: TSH: 1.51 mIU/L (ref 0.40–4.50)

## 2020-04-09 ENCOUNTER — Telehealth: Payer: Self-pay | Admitting: Family Medicine

## 2020-04-09 NOTE — Telephone Encounter (Signed)
[name] patient daughter states the [employer] hasn't receive any claims for this year.  She states that she was advised that claims had been sent.

## 2020-04-09 NOTE — Telephone Encounter (Signed)
Daughter [name])  is requesting CB [phone], nothing has changed per [facility] 10 min ago, now they state in collection, she appreciates all your help as thought was taken care of but seems to have not been FU for [employer]

## 2020-04-09 NOTE — Telephone Encounter (Signed)
I return [name] call to let her know that her dad have not been in the office for any appt this year so there wil not be any claims at [facility]

## 2020-12-18 ENCOUNTER — Other Ambulatory Visit: Payer: Self-pay | Admitting: Family Medicine

## 2020-12-18 DIAGNOSIS — E119 Type 2 diabetes mellitus without complications: Secondary | ICD-10-CM

## 2020-12-18 DIAGNOSIS — I10 Essential (primary) hypertension: Secondary | ICD-10-CM

## 2020-12-18 DIAGNOSIS — E1121 Type 2 diabetes mellitus with diabetic nephropathy: Secondary | ICD-10-CM

## 2020-12-18 DIAGNOSIS — Z8673 Personal history of transient ischemic attack (TIA), and cerebral infarction without residual deficits: Secondary | ICD-10-CM

## 2020-12-18 NOTE — Telephone Encounter (Signed)
Requested medication (s) are due for refill today: Yes  Requested medication (s) are on the active medication list: Yes  Last refill:    Future visit scheduled: Yes  Notes to clinic:  Prescriptions have expired.    Requested Prescriptions  Pending Prescriptions Disp Refills   clopidogrel (PLAVIX) 75 MG tablet [Pharmacy Med Name: CLOPIDOGREL 75 MG TABLET] 90 tablet 3    Sig: TAKE 1 TABLET BY MOUTH EVERY DAY     Hematology: Antiplatelets - clopidogrel Failed - 12/18/2020 10:35 AM      Failed - Evaluate AST, ALT within 2 months of therapy initiation.      Failed - ALT in normal range and within 360 days    ALT  Date Value Ref Range Status  12/23/2019 25 9 - 46 U/L Final   SGPT (ALT)  Date Value Ref Range Status  10/19/2011 23 12 - 78 U/L Final          Failed - AST in normal range and within 360 days    AST  Date Value Ref Range Status  12/23/2019 18 10 - 35 U/L Final   SGOT(AST)  Date Value Ref Range Status  10/19/2011 20 15 - 37 Unit/L Final          Failed - HCT in normal range and within 180 days    HCT  Date Value Ref Range Status  12/23/2019 38.8 38.5 - 50.0 % Final  10/19/2011 41.1 40.0 - 52.0 % Final          Failed - HGB in normal range and within 180 days    Hemoglobin  Date Value Ref Range Status  12/23/2019 12.5 (L) 13.2 - 17.1 g/dL Final   HGB  Date Value Ref Range Status  10/19/2011 13.4 13.0 - 18.0 g/dL Final          Failed - PLT in normal range and within 180 days    Platelets  Date Value Ref Range Status  12/23/2019 200 140 - 400 Thousand/uL Final   Platelet  Date Value Ref Range Status  10/19/2011 251 150 - 440 x10 3/mm 3 Final          Failed - Valid encounter within last 6 months    Recent Outpatient Visits           12 months ago Annual physical exam   [place], DO   1 year ago Annual physical exam   [facility] Olin Hauser, DO   3 years ago  Annual physical exam   [facility] Olin Hauser, DO   4 years ago Annual physical exam   [facility] [place], Devonne Doughty, DO   4 years ago Need for immunization against influenza   [facility] Luan Pulling, Ronelle Nigh., MD       Future Appointments             In 1 week [place], Devonne Doughty, DO [facility]             carvedilol (COREG) 3.125 MG tablet [Pharmacy Med Name: CARVEDILOL 3.125 MG TABLET] 90 tablet 3    Sig: TAKE 1/2 TABLET BY MOUTH TWICE A DAY     Cardiovascular:  Beta Blockers Failed - 12/18/2020 10:35 AM      Failed - Valid encounter within last 6 months    Recent Outpatient Visits  12 months ago Annual physical exam   [place], Devonne Doughty, DO   1 year ago Annual physical exam   [facility] [place], Devonne Doughty, DO   3 years ago Annual physical exam   [facility] [place], DO   4 years ago Annual physical exam   [facility] [place], Devonne Doughty, DO   4 years ago Need for immunization against influenza   [facility] Luan Pulling, Ronelle Nigh., MD       Future Appointments             In 1 week Parks Ranger, [facility] [place] BP in normal range    BP Readings from Last 1 Encounters:  12/23/19 117/83          Passed - Last Heart Rate in normal range    Pulse Readings from Last 1 Encounters:  12/23/19 80           lisinopril (ZESTRIL) 2.5 MG tablet [Pharmacy Med Name: LISINOPRIL 2.5 MG TABLET] 90 tablet 3    Sig: TAKE 1 TABLET BY MOUTH EVERY DAY     Cardiovascular:  ACE Inhibitors Failed - 12/18/2020 10:35 AM      Failed - Cr in normal range and within 180 days    Creat  Date Value Ref Range Status  12/23/2019 1.62 (H) 0.70 - 1.25 mg/dL Final    Comment:    For patients >1  years of age, the reference limit for Creatinine is approximately 13% higher for people identified as African-American. .           Failed - K in normal range and within 180 days    Potassium  Date Value Ref Range Status  12/23/2019 4.3 3.5 - 5.3 mmol/L Final  10/20/2011 3.9 3.5 - 5.1 mmol/L Final          Failed - Valid encounter within last 6 months    Recent Outpatient Visits           12 months ago Annual physical exam   [place], DO   1 year ago Annual physical exam   [place], DO   3 years ago Annual physical exam   [place], DO   4 years ago Annual physical exam   [facility] Olin Hauser, DO   4 years ago Need for immunization against influenza   [facility] Luan Pulling, Ronelle Nigh., MD       Future Appointments             In 1 week [place], Devonne Doughty, [facility] [place] - Patient is not pregnant      Passed - Last BP in normal range    BP Readings from Last 1 Encounters:  12/23/19 117/83           metFORMIN (GLUCOPHAGE) 500 MG tablet [Pharmacy Med Name: METFORMIN HCL 500 MG TABLET] 180 tablet 3    Sig: TAKE 1 TABLET BY MOUTH 2 TIMES DAILY WITH A MEAL.     Endocrinology:  Diabetes - Biguanides Failed - 12/18/2020 10:35 AM      Failed - Cr  in normal range and within 360 days    Creat  Date Value Ref Range Status  12/23/2019 1.62 (H) 0.70 - 1.25 mg/dL Final    Comment:    For patients >78 years of age, the reference limit for Creatinine is approximately 13% higher for people identified as African-American. .           Failed - HBA1C is between 0 and 7.9 and within 180 days    Hemoglobin A1C  Date Value Ref Range Status  10/03/2011 6.8 (H) 4.2 - 6.3 % Final    Comment:    The American Diabetes Association recommends that a  primary goal of therapy should be <7% and that physicians should reevaluate the treatment regimen in patients with HbA1c values consistently >8%.    Hgb A1c MFr Bld  Date Value Ref Range Status  12/23/2019 6.7 (H) <5.7 % of total Hgb Final    Comment:    For someone without known diabetes, a hemoglobin A1c value of 6.5% or greater indicates that they may have  diabetes and this should be confirmed with a follow-up  test. . For someone with known diabetes, a value <7% indicates  that their diabetes is well controlled and a value  greater than or equal to 7% indicates suboptimal  control. A1c targets should be individualized based on  duration of diabetes, age, comorbid conditions, and  other considerations. . Currently, no consensus exists regarding use of hemoglobin A1c for diagnosis of diabetes for children. .           Failed - eGFR in normal range and within 360 days    GFR, Est African American  Date Value Ref Range Status  12/23/2019 53 (L) > OR = 60 mL/min/1.37m Final   GFR, Est Non African American  Date Value Ref Range Status  12/23/2019 45 (L) > OR = 60 mL/min/1.767mFinal          Failed - Valid encounter within last 6 months    Recent Outpatient Visits           12 months [facility]   [facility]   [facility]   [facility]   4 years ago Need for immunization against influenza   [facility]       Future Appointments             In 1 week [facility]

## 2020-12-24 ENCOUNTER — Ambulatory Visit: Payer: 59 | Admitting: Family Medicine

## 2020-12-26 ENCOUNTER — Ambulatory Visit (INDEPENDENT_AMBULATORY_CARE_PROVIDER_SITE_OTHER): Payer: 59 | Admitting: Family Medicine

## 2020-12-26 ENCOUNTER — Encounter: Payer: Self-pay | Admitting: Family Medicine

## 2020-12-26 ENCOUNTER — Other Ambulatory Visit: Payer: Self-pay

## 2020-12-26 VITALS — BP 123/85 | HR 85 | Ht 66.0 in | Wt 146.4 lb

## 2020-12-26 DIAGNOSIS — Z8673 Personal history of transient ischemic attack (TIA), and cerebral infarction without residual deficits: Secondary | ICD-10-CM

## 2020-12-26 DIAGNOSIS — Z125 Encounter for screening for malignant neoplasm of prostate: Secondary | ICD-10-CM

## 2020-12-26 DIAGNOSIS — E1169 Type 2 diabetes mellitus with other specified complication: Secondary | ICD-10-CM

## 2020-12-26 DIAGNOSIS — N1832 Chronic kidney disease, stage 3b: Secondary | ICD-10-CM

## 2020-12-26 DIAGNOSIS — Z0000 Encounter for general adult medical examination without abnormal findings: Secondary | ICD-10-CM

## 2020-12-26 DIAGNOSIS — L308 Other specified dermatitis: Secondary | ICD-10-CM

## 2020-12-26 DIAGNOSIS — E041 Nontoxic single thyroid nodule: Secondary | ICD-10-CM

## 2020-12-26 DIAGNOSIS — E1121 Type 2 diabetes mellitus with diabetic nephropathy: Secondary | ICD-10-CM

## 2020-12-26 DIAGNOSIS — Z23 Encounter for immunization: Secondary | ICD-10-CM | POA: Diagnosis not present

## 2020-12-26 DIAGNOSIS — I129 Hypertensive chronic kidney disease with stage 1 through stage 4 chronic kidney disease, or unspecified chronic kidney disease: Secondary | ICD-10-CM

## 2020-12-26 DIAGNOSIS — N183 Chronic kidney disease, stage 3 unspecified: Secondary | ICD-10-CM

## 2020-12-26 DIAGNOSIS — E785 Hyperlipidemia, unspecified: Secondary | ICD-10-CM

## 2020-12-26 DIAGNOSIS — R351 Nocturia: Secondary | ICD-10-CM

## 2020-12-26 MED ORDER — TRIAMCINOLONE ACETONIDE 0.5 % EX CREA: 1.0000 "application " | TOPICAL_CREAM | Freq: Two times a day (BID) | CUTANEOUS | 2 refills | Status: DC

## 2020-12-26 MED ORDER — ROSUVASTATIN CALCIUM 20 MG PO TABS: 20.0000 mg | ORAL_TABLET | Freq: Every day | ORAL | 3 refills | Status: DC

## 2020-12-26 NOTE — Patient Instructions (Addendum)
Thank you for coming to the office today.  Labs drawn today, stay tuned for results on Ingenio refilled, some were ordered last week, some today. 90 day with 3 refills for 1 year.  Foot check was normal today  Flu Shot today  Future can consider Pneumonia vaccine pre- age 61, otherwise okay to proceed w/ Age 61+ new Pneumonia Vaccine Prevnar-20 PCV 20 and that is the final dose pneumonia vaccine.  DUE for FASTING BLOOD WORK (no food or drink after midnight before the lab appointment, only water or coffee without cream/sugar on the morning of)  SCHEDULE "Lab Only" visit in the morning at the clinic for lab draw in 1 YEAR  - Make sure Lab Only appointment is at about 1 week before your next appointment, so that results will be available  For Lab Results, once available within 2-3 days of blood draw, you can can log in to MyChart online to view your results and a brief explanation. Also, we can discuss results at next follow-up visit.   Please schedule a Follow-up Appointment to: Return in about 1 year (around 12/26/2021) for 1 year Annual Physical, AM fasting lab AFTER.  If you have any other questions or concerns, please feel free to call the office or send a message through [place]. You may also schedule an earlier appointment if necessary.  Additionally, you may be receiving a survey about your experience at our office within a few days to 1 week by e-mail or mail. We value your feedback.  Nobie Putnam, DO [place]

## 2020-12-26 NOTE — Assessment & Plan Note (Signed)
Check TSH Asymptomatic

## 2020-12-26 NOTE — Assessment & Plan Note (Addendum)
Followed by Nephrology [facility] now Concern with progression of CKD III to stage IIIb now  Plan - Improve hydration as discussed - Limit NSAIDs Continue back on ACEi 2.5 low dose 

## 2020-12-26 NOTE — Assessment & Plan Note (Signed)
Well controlled cholesterol panel on current statin Previous changed from pravastatin to rosuvastatin  due TODAY for fasting lipid Elevated ASCVD risk with prior CVA  Plan: 1. Continue current meds - Rosuvastatin 20mg  daily refilled 2. Continue Plavix 75 + ASA 81 secondary ASCVD risk reduction 3. Encourage improved lifestyle - low carb/cholesterol, reduce portion size, continue improving regular exercise

## 2020-12-26 NOTE — Assessment & Plan Note (Signed)
Controlled DM A1c 6.7 previous, now due for A1c No known hypoglycemia or hyperglycemia at this time. Complications - Nephropathy with CKDIIIb now elevated Creatinine / reduced GFR now, other including hyperlipidemia specifically hypertriglyceridemia, and history of CVA - increases risk of future cardiovascular complications   Plan:  1. Continue current therapy - Metformin 500mg  BID - Future May consider oral GLP1 or injectable GLP1 in future or DPP4 2. Encourage improved lifestyle - low carb, low sugar diet, reduce portion size, continue improving regular exercise 3. Check CBG, bring log to next visit for review 4. Continue Plavix+ASA, ACEi Statin 5. Advised to schedule DM ophtho exam, send record - should schedule with [facility] DM Foot exam today  F/u 6 months repeat A1c

## 2020-12-26 NOTE — Progress Notes (Signed)
Subjective:    Patient ID: [name], male    DOB: [birth year], 60 y.o.   MRN: [id number]  [name] is a 60 y.o. male presenting on 12/26/2020 for Annual Exam   HPI  History provided by patient in [place] and interpretation on phone   Here for Annual Physical and Fasting lab only check today.    CHRONIC DM, Type 2 w/ CKD-3b Reports no concerns. Last A1c 6.7 12/2019 - due for repeat lab now. Followed by Hoffman Due for labs today Similar CBGs: Avg 100-120s, Low none < 100, High < 150. Checks CBGs x 1 daily Meds: Metformin 500mg  BID Reports good compliance. Tolerating well w/o side-effects Currently on ACEi on Lisinopril 2.5mg  daily Taking Statin and on anti-platelet Lifestyle: - Regarding hydration, he says drinks 1-2 cups water a day, not hydrated well. - Diet (Improved diet overall, tries to maintain balanced diet, tries to limit sugars) - Exercise (Stays active working at [employer], but does not do regular exercise) - Followed by [employer], [facility] - last visit 1 year ago - he is due to schedule Annual DM Eye Visit Denies hypoglycemia   CHRONIC HTN:  Reports he checks BP at home, 120/80s on average. Mostly unchanged since previous visit. See above CKD-III Followed by [name] Dr [name] Current Meds - Carvedilol 3.125mg  HALF tab BID, Lisinopril 2.5mg  daily Reports good compliance, took meds today. Tolerating well, w/o complaints.   HYPERLIPIDEMIA: - Reports no concerns. Last lipid panel 2021 very well controlled, low LDL 26, normal HDL 54 and low TG 107, previously had high TG. - Currently taking Rosuvastatin 20mg  daily, tolerating well without side effects or myalgias   History of CVA, without significant residual deficit - Reported prior history of CVA in 2013, has done well since without any significant residual deficits, he does have history of slight facial asymmetry but no functional deficit with weakness or sensation change. Previously taking  Aggrenox for stroke risk reduction. [name] switched off Aggrenox and started on Plavix 75mg  daily and Aspirin 81mg  daily Today he continues to do well on combination of Plavix 75mg  daily and ASA 81mg  daily   History of thyroid nodule Normal TSH lab. Due today   Health Maintenance:   UTD COVID Vaccine. X 2 doses   Due for Flu Shot, will receive today    Colon CA Screening: Last Colon cancer screening with Cologuard 07/24/16 POSITIVE, then referred for Colonoscopy (done by Dr [name] AGI), results with x 1 polyp hyperplastic benign, good for 10 years, done 12/02/16 - next due in 2028. Currently asymptomatic. No known family history of colon CA.   - Prostate CA Screening: No known personal or family history of prostate CA. Prior PSA reported normal. Last PSA 0.9 (2021) due for repeat. Asymptomatic prostate   - UTD routine Hepatitis C screen was negative 07/2016. Declines HIV - UTD Pneumovax-23, in DM, now next due age 77 for Prevnar-13   Depression screen [facility] 2/9 12/23/2019 02/01/2019 07/28/2017  Decreased Interest 0 0 0  Down, Depressed, Hopeless 0 0 0  PHQ - 2 Score 0 0 0    Past Medical History:  Diagnosis Date   Cyst of thyroid 2013   right   Hyperlipidemia    Kidney stones    Stroke [facility]) 2013   Past Surgical History:  Procedure Laterality Date   APPENDECTOMY  2003   COLONOSCOPY WITH PROPOFOL N/A 12/02/2016   Procedure: COLONOSCOPY WITH PROPOFOL;  Surgeon: Lucilla Lame, MD;  Location: [facility] ENDOSCOPY;  Service: Endoscopy;  Laterality: N/A;   FINE NEEDLE ASPIRATION Right 09-28-13   thyroid nodule/BENIGN FOLLICULAR CELLS, COLLOID   Social History   Socioeconomic History   Marital status: Married    Spouse name: Not on file   Number of children: Not on file   Years of education: Not on file   Highest education level: Not on file  Occupational History   Not on file  Tobacco Use   Smoking status: Never   Smokeless tobacco: Never  Substance and Sexual Activity    Alcohol use: No   Drug use: No   Sexual activity: Not on file  Other Topics Concern   Not on file  Social History Narrative   Not on file   Social Determinants of Health   Financial Resource Strain: Not on file  Food Insecurity: Not on file  Transportation Needs: Not on file  Physical Activity: Not on file  Stress: Not on file  Social Connections: Not on file  Intimate Partner Violence: Not on file   Family History  Family history unknown: Yes   Current Outpatient Medications on File Prior to Visit  Medication Sig   BAYER ASPIRIN PO Take 81 mg by mouth.   carvedilol (COREG) 3.125 MG tablet TAKE 1/2 TABLET BY MOUTH TWICE A DAY   clopidogrel (PLAVIX) 75 MG tablet TAKE 1 TABLET BY MOUTH EVERY DAY   lisinopril (ZESTRIL) 2.5 MG tablet TAKE 1 TABLET BY MOUTH EVERY DAY   metFORMIN (GLUCOPHAGE) 500 MG tablet TAKE 1 TABLET BY MOUTH 2 TIMES DAILY WITH A MEAL.   Multiple Vitamin (MULTI-VITAMINS) TABS Take by mouth.   No current facility-administered medications on file prior to visit.    Review of Systems  Constitutional:  Negative for activity change, appetite change, chills, diaphoresis, fatigue and fever.  HENT:  Negative for congestion and hearing loss.   Eyes:  Negative for visual disturbance.  Respiratory:  Negative for cough, chest tightness, shortness of breath and wheezing.   Cardiovascular:  Negative for chest pain, palpitations and leg swelling.  Gastrointestinal:  Negative for abdominal pain, constipation, diarrhea, nausea and vomiting.  Genitourinary:  Negative for dysuria, frequency and hematuria.  Musculoskeletal:  Negative for arthralgias and neck pain.  Skin:  Negative for rash.  Neurological:  Negative for dizziness, weakness, light-headedness, numbness and headaches.  Hematological:  Negative for adenopathy.  Psychiatric/Behavioral:  Negative for behavioral problems, dysphoric mood and sleep disturbance.   Per HPI unless specifically indicated above      Objective:    BP 123/85    Pulse 85    Ht 5\' 6"  (1.676 m)    Wt 146 lb 6.4 oz (66.4 kg)    SpO2 100%    BMI 23.63 kg/m   Wt Readings from Last 3 Encounters:  12/26/20 146 lb 6.4 oz (66.4 kg)  12/23/19 139 lb (63 kg)  02/01/19 145 lb (65.8 kg)    Physical Exam Vitals and nursing note reviewed.  Constitutional:      General: He is not in acute distress.    Appearance: He is well-developed. He is not diaphoretic.     Comments: Well-appearing, comfortable, cooperative  HENT:     Head: Normocephalic and atraumatic.  Eyes:     General:        Right eye: No discharge.        Left eye: No discharge.     Conjunctiva/sclera: Conjunctivae normal.     Pupils: Pupils are equal, round, and  reactive to light.  Neck:     Thyroid: No thyromegaly.     Vascular: No carotid bruit.  Cardiovascular:     Rate and Rhythm: Normal rate and regular rhythm.     Pulses: Normal pulses.     Heart sounds: Normal heart sounds. No murmur heard. Pulmonary:     Effort: Pulmonary effort is normal. No respiratory distress.     Breath sounds: Normal breath sounds. No wheezing or rales.  Abdominal:     General: Bowel sounds are normal. There is no distension.     Palpations: Abdomen is soft. There is no mass.     Tenderness: There is no abdominal tenderness.  Musculoskeletal:        General: No tenderness. Normal range of motion.     Cervical back: Normal range of motion and neck supple.     Right lower leg: No edema.     Left lower leg: No edema.     Comments: Upper / Lower Extremities: - Normal muscle tone, strength bilateral upper extremities 5/5, lower extremities 5/5  Lymphadenopathy:     Cervical: No cervical adenopathy.  Skin:    General: Skin is warm and dry.     Findings: No erythema or rash.  Neurological:     Mental Status: He is alert and oriented to person, place, and time.     Comments: Distal sensation intact to light touch all extremities  Psychiatric:        Mood and Affect: Mood  normal.        Behavior: Behavior normal.        Thought Content: Thought content normal.     Comments: Well groomed, good eye contact, normal speech and thoughts   Diabetic Foot Exam - Simple   Simple Foot Form Diabetic Foot exam was performed with the following findings: Yes 12/26/2020  9:44 AM  Visual Inspection No deformities, no ulcerations, no other skin breakdown bilaterally: Yes Sensation Testing Intact to touch and monofilament testing bilaterally: Yes Pulse Check Posterior Tibialis and Dorsalis pulse intact bilaterally: Yes Comments      Results for orders placed or performed in visit on 12/23/19  Hemoglobin A1c  Result Value Ref Range   Hgb A1c MFr Bld 6.7 (H) <5.7 % of total Hgb   Mean Plasma Glucose 146 mg/dL   eAG (mmol/L) 8.1 mmol/L  CBC with Differential/Platelet  Result Value Ref Range   WBC 5.5 3.8 - 10.8 Thousand/uL   RBC 4.20 4.20 - 5.80 Million/uL   Hemoglobin 12.5 (L) 13.2 - 17.1 g/dL   HCT 38.8 38.5 - 50.0 %   MCV 92.4 80.0 - 100.0 fL   MCH 29.8 27.0 - 33.0 pg   MCHC 32.2 32.0 - 36.0 g/dL   RDW 12.8 11.0 - 15.0 %   Platelets 200 140 - 400 Thousand/uL   MPV 11.3 7.5 - 12.5 fL   Neutro Abs 3,119 1,500 - 7,800 cells/uL   Lymphs Abs 1,650 850 - 3,900 cells/uL   Absolute Monocytes 462 200 - 950 cells/uL   Eosinophils Absolute 182 15 - 500 cells/uL   Basophils Absolute 88 0 - 200 cells/uL   Neutrophils Relative % 56.7 %   Total Lymphocyte 30.0 %   Monocytes Relative 8.4 %   Eosinophils Relative 3.3 %   Basophils Relative 1.6 %  COMPLETE METABOLIC PANEL WITH GFR  Result Value Ref Range   Glucose, Bld 137 (H) 65 - 99 mg/dL   BUN 29 (H) 7 - 25  mg/dL   Creat 1.62 (H) 0.70 - 1.25 mg/dL   GFR, Est Non African American 45 (L) > OR = 60 mL/min/1.46m2   GFR, Est African American 53 (L) > OR = 60 mL/min/1.60m2   BUN/Creatinine Ratio 18 6 - 22 (calc)   Sodium 140 135 - 146 mmol/L   Potassium 4.3 3.5 - 5.3 mmol/L   Chloride 104 98 - 110 mmol/L   CO2 30  20 - 32 mmol/L   Calcium 9.6 8.6 - 10.3 mg/dL   Total Protein 7.3 6.1 - 8.1 g/dL   Albumin 4.4 3.6 - 5.1 g/dL   Globulin 2.9 1.9 - 3.7 g/dL (calc)   AG Ratio 1.5 1.0 - 2.5 (calc)   Total Bilirubin 0.9 0.2 - 1.2 mg/dL   Alkaline phosphatase (APISO) 63 35 - 144 U/L   AST 18 10 - 35 U/L   ALT 25 9 - 46 U/L  Lipid panel  Result Value Ref Range   Cholesterol 101 <200 mg/dL   HDL 52 > OR = 40 mg/dL   Triglycerides 99 <150 mg/dL   LDL Cholesterol (Calc) 31 mg/dL (calc)   Total CHOL/HDL Ratio 1.9 <5.0 (calc)   Non-HDL Cholesterol (Calc) 49 <130 mg/dL (calc)  PSA  Result Value Ref Range   PSA 0.79 < OR = 4.0 ng/mL  TSH  Result Value Ref Range   TSH 1.51 0.40 - 4.50 mIU/L      Assessment & Plan:   Problem List Items Addressed This Visit     Screening for prostate cancer   Relevant Orders   PSA   Nontoxic uninodular goiter    Check TSH Asymptomatic      Relevant Orders   TSH   Hyperlipidemia associated with type 2 diabetes mellitus (HCC)    Well controlled cholesterol panel on current statin Previous changed from pravastatin to rosuvastatin  due TODAY for fasting lipid Elevated ASCVD risk with prior CVA  Plan: 1. Continue current meds - Rosuvastatin 20mg  daily refilled 2. Continue Plavix 75 + ASA 81 secondary ASCVD risk reduction 3. Encourage improved lifestyle - low carb/cholesterol, reduce portion size, continue improving regular exercise        Relevant Medications   rosuvastatin (CRESTOR) 20 MG tablet   Other Relevant Orders   Lipid panel   TSH   History of CVA (cerebrovascular accident)    Stable clinically without residual deficits from prior CVA Has some baseline slightly asymmetrical mouth possibly residual, without change History CVA 2013 Off Aggrenox due to cost  Plan Continue yearly f/u with [facility] Neurology On Plavix 75mg  and ASA 81mg  DAPT per Neurology recommendations Continue Pravastatin for ASCVD risk reduction      Relevant Orders   COMPLETE  METABOLIC PANEL WITH GFR   Controlled type 2 diabetes mellitus with diabetic nephropathy, without long-term current use of insulin (HCC)    Controlled DM A1c 6.7 previous, now due for A1c No known hypoglycemia or hyperglycemia at this time. Complications - Nephropathy with CKDIIIb now elevated Creatinine / reduced GFR now, other including hyperlipidemia specifically hypertriglyceridemia, and history of CVA - increases risk of future cardiovascular complications   Plan:  1. Continue current therapy - Metformin 500mg  BID - Future May consider oral GLP1 or injectable GLP1 in future or DPP4 2. Encourage improved lifestyle - low carb, low sugar diet, reduce portion size, continue improving regular exercise 3. Check CBG, bring log to next visit for review 4. Continue Plavix+ASA, ACEi Statin 5. Advised to schedule DM ophtho exam,  send record - should schedule with [facility] DM Foot exam today  F/u 6 months repeat A1c      Relevant Medications   rosuvastatin (CRESTOR) 20 MG tablet   Other Relevant Orders   COMPLETE METABOLIC PANEL WITH GFR   Lipid panel   CKD (chronic kidney disease), stage III ([place])    Followed by Hoffman [facility] now Concern with progression of CKD III to stage IIIb now  Plan - Improve hydration as discussed - Limit NSAIDs Continue back on ACEi 2.5 low dose      Relevant Orders   COMPLETE METABOLIC PANEL WITH GFR   CBC with Differential/Platelet   Benign hypertension with CKD (chronic kidney disease) stage III (HCC)    Well-controlled HTN - Home BP readings normal  History of CVA Complication with BWL-8L advancing w/ elevated Cr / reduced GFR Followed by Hoffman   Plan:  1 Continue current BP regimen back on Lisinopril 2.5mg  daily, and Carvedilol 3.125mg  HALF tab BID (dose =1.57) 2. Encourage improved lifestyle - low sodium diet, regular exercise 3. Continue monitor BP outside office, bring readings to next visit, if persistently >140/90 or new symptoms  notify office sooner      Relevant Medications   rosuvastatin (CRESTOR) 20 MG tablet   Other Relevant Orders   COMPLETE METABOLIC PANEL WITH GFR   Lipid panel   CBC with Differential/Platelet   Other Visit Diagnoses     Annual physical exam    -  Primary   Relevant Orders   COMPLETE METABOLIC PANEL WITH GFR   Lipid panel   CBC with Differential/Platelet   Hemoglobin A1c   Other eczema       Trial on topical steroid cream, triamcinolone, routine skin care reviewed, RICE therapy for swelling   Relevant Medications   triamcinolone cream (KENALOG) 0.5 %   Needs flu shot       Relevant Orders   Flu Vaccine QUAD 61mo+IM (Fluarix, Fluzone & Alfiuria Quad PF) (Completed)   Nocturia       Relevant Orders   PSA       Updated Health Maintenance information Flu Shot today Fasting labs ordered today pending result Encouraged improvement to lifestyle with diet and exercise Goal of weight loss     Meds ordered this encounter  Medications   rosuvastatin (CRESTOR) 20 MG tablet    Sig: Take 1 tablet (20 mg total) by mouth daily.    Dispense:  90 tablet    Refill:  3   triamcinolone cream (KENALOG) 0.5 %    Sig: Apply 1 application topically 2 (two) times daily. To affected areas, for up to 2 weeks.    Dispense:  30 g    Refill:  2      Follow up plan: Return in about 1 year (around 12/26/2021) for 1 year Annual Physical, AM fasting lab AFTER.  Nobie Putnam, [facility] 12/26/2020, 9:37 AM

## 2020-12-26 NOTE — Assessment & Plan Note (Signed)
Well-controlled HTN - Home BP readings normal  History of CVA Complication with CKD-3b advancing w/ elevated Cr / reduced GFR Followed by Nephrology   Plan:  1 Continue current BP regimen back on Lisinopril 2.5mg daily, and Carvedilol 3.125mg HALF tab BID (dose =1.57) 2. Encourage improved lifestyle - low sodium diet, regular exercise 3. Continue monitor BP outside office, bring readings to next visit, if persistently >140/90 or new symptoms notify office sooner 

## 2020-12-26 NOTE — Assessment & Plan Note (Signed)
Stable clinically without residual deficits from prior CVA Has some baseline slightly asymmetrical mouth possibly residual, without change History CVA 2013 Off Aggrenox due to cost  Plan Continue yearly f/u with [facility] On Plavix 75mg and ASA 81mg DAPT per Neurology recommendations Continue Pravastatin for ASCVD risk reduction 

## 2020-12-27 LAB — LIPID PANEL
Cholesterol: 94 mg/dL (ref <200)
HDL: 55 mg/dL (ref >=40)
LDL Cholesterol (Calc): 23 mg/dL (calc)
Non-HDL Cholesterol (Calc): 39 mg/dL (calc) (ref <130)
Total CHOL/HDL Ratio: 1.7 (calc) (ref <5.0)
Triglycerides: 77 mg/dL (ref <150)

## 2020-12-27 LAB — CBC WITH DIFFERENTIAL/PLATELET
Absolute Monocytes: 502 cells/uL (ref 200–950)
Basophils Absolute: 68 cells/uL (ref 0–200)
Basophils Relative: 1.2 %
Eosinophils Absolute: 222 cells/uL (ref 15–500)
Eosinophils Relative: 3.9 %
HCT: 39.7 % (ref 38.5–50.0)
Hemoglobin: 12.7 g/dL — ABNORMAL LOW (ref 13.2–17.1)
Lymphs Abs: 1499 cells/uL (ref 850–3900)
MCH: 30 pg (ref 27.0–33.0)
MCHC: 32 g/dL (ref 32.0–36.0)
MCV: 93.6 fL (ref 80.0–100.0)
MPV: 11.7 fL (ref 7.5–12.5)
Monocytes Relative: 8.8 %
Neutro Abs: 3409 cells/uL (ref 1500–7800)
Neutrophils Relative %: 59.8 %
Platelets: 220 10*3/uL (ref 140–400)
RBC: 4.24 10*6/uL (ref 4.20–5.80)
RDW: 12.6 % (ref 11.0–15.0)
Total Lymphocyte: 26.3 %
WBC: 5.7 10*3/uL (ref 3.8–10.8)

## 2020-12-27 LAB — COMPLETE METABOLIC PANEL WITH GFR
AG Ratio: 1.6 (calc) (ref 1.0–2.5)
ALT: 21 U/L (ref 9–46)
AST: 18 U/L (ref 10–35)
Albumin: 4.2 g/dL (ref 3.6–5.1)
Alkaline phosphatase (APISO): 53 U/L (ref 35–144)
BUN/Creatinine Ratio: 16 (calc) (ref 6–22)
BUN: 27 mg/dL — ABNORMAL HIGH (ref 7–25)
CO2: 27 mmol/L (ref 20–32)
Calcium: 9.2 mg/dL (ref 8.6–10.3)
Chloride: 106 mmol/L (ref 98–110)
Creat: 1.64 mg/dL — ABNORMAL HIGH (ref 0.70–1.35)
Globulin: 2.7 g/dL (calc) (ref 1.9–3.7)
Glucose, Bld: 144 mg/dL — ABNORMAL HIGH (ref 65–99)
Potassium: 4.4 mmol/L (ref 3.5–5.3)
Sodium: 141 mmol/L (ref 135–146)
Total Bilirubin: 0.7 mg/dL (ref 0.2–1.2)
Total Protein: 6.9 g/dL (ref 6.1–8.1)
eGFR: 47 mL/min/{1.73_m2} — ABNORMAL LOW (ref >=60)

## 2020-12-27 LAB — PSA: PSA: 0.79 ng/mL (ref <=4.00)

## 2020-12-27 LAB — HEMOGLOBIN A1C
Hgb A1c MFr Bld: 7.1 % of total Hgb — ABNORMAL HIGH (ref <5.7)
Mean Plasma Glucose: 157 mg/dL
eAG (mmol/L): 8.7 mmol/L

## 2020-12-27 LAB — TSH: TSH: 2.09 mIU/L (ref 0.40–4.50)

## 2021-07-11 ENCOUNTER — Encounter: Payer: Self-pay | Admitting: Family Medicine

## 2021-07-11 DIAGNOSIS — I10 Essential (primary) hypertension: Secondary | ICD-10-CM

## 2021-07-11 DIAGNOSIS — E1169 Type 2 diabetes mellitus with other specified complication: Secondary | ICD-10-CM

## 2021-07-11 DIAGNOSIS — Z8673 Personal history of transient ischemic attack (TIA), and cerebral infarction without residual deficits: Secondary | ICD-10-CM

## 2021-07-11 DIAGNOSIS — E1121 Type 2 diabetes mellitus with diabetic nephropathy: Secondary | ICD-10-CM

## 2021-07-11 DIAGNOSIS — E119 Type 2 diabetes mellitus without complications: Secondary | ICD-10-CM

## 2021-07-11 MED ORDER — METFORMIN HCL 500 MG PO TABS: 500.0000 mg | ORAL_TABLET | Freq: Two times a day (BID) | ORAL | 3 refills | Status: DC

## 2021-07-11 MED ORDER — CARVEDILOL 3.125 MG PO TABS: 1.5625 mg | ORAL_TABLET | Freq: Two times a day (BID) | ORAL | 3 refills | Status: DC

## 2021-07-11 MED ORDER — ROSUVASTATIN CALCIUM 20 MG PO TABS: 20.0000 mg | ORAL_TABLET | Freq: Every day | ORAL | 3 refills | Status: DC

## 2021-07-11 MED ORDER — CLOPIDOGREL BISULFATE 75 MG PO TABS: 75.0000 mg | ORAL_TABLET | Freq: Every day | ORAL | 3 refills | Status: DC

## 2021-07-11 MED ORDER — LISINOPRIL 2.5 MG PO TABS: 2.5000 mg | ORAL_TABLET | Freq: Every day | ORAL | 3 refills | Status: DC

## 2022-01-30 ENCOUNTER — Ambulatory Visit (INDEPENDENT_AMBULATORY_CARE_PROVIDER_SITE_OTHER): Payer: 59 | Admitting: Family Medicine

## 2022-01-30 ENCOUNTER — Encounter: Payer: Self-pay | Admitting: Family Medicine

## 2022-01-30 VITALS — BP 120/84 | HR 83 | Temp 97.1°F | Wt 147.0 lb

## 2022-01-30 DIAGNOSIS — I129 Hypertensive chronic kidney disease with stage 1 through stage 4 chronic kidney disease, or unspecified chronic kidney disease: Secondary | ICD-10-CM

## 2022-01-30 DIAGNOSIS — Z8673 Personal history of transient ischemic attack (TIA), and cerebral infarction without residual deficits: Secondary | ICD-10-CM

## 2022-01-30 DIAGNOSIS — R351 Nocturia: Secondary | ICD-10-CM

## 2022-01-30 DIAGNOSIS — E1121 Type 2 diabetes mellitus with diabetic nephropathy: Secondary | ICD-10-CM | POA: Diagnosis not present

## 2022-01-30 DIAGNOSIS — Z0000 Encounter for general adult medical examination without abnormal findings: Secondary | ICD-10-CM | POA: Diagnosis not present

## 2022-01-30 DIAGNOSIS — Z23 Encounter for immunization: Secondary | ICD-10-CM

## 2022-01-30 DIAGNOSIS — E1169 Type 2 diabetes mellitus with other specified complication: Secondary | ICD-10-CM

## 2022-01-30 DIAGNOSIS — E785 Hyperlipidemia, unspecified: Secondary | ICD-10-CM

## 2022-01-30 DIAGNOSIS — N183 Chronic kidney disease, stage 3 unspecified: Secondary | ICD-10-CM | POA: Diagnosis not present

## 2022-01-30 DIAGNOSIS — N1832 Chronic kidney disease, stage 3b: Secondary | ICD-10-CM

## 2022-01-30 DIAGNOSIS — I10 Essential (primary) hypertension: Secondary | ICD-10-CM

## 2022-01-30 NOTE — Assessment & Plan Note (Signed)
Stable clinically without residual deficits from prior CVA Has some baseline slightly asymmetrical mouth possibly residual, without change History CVA 2013 Off Aggrenox due to cost  Plan Continue yearly f/u with [facility]rology On Plavix '75mg'$  and ASA '81mg'$  DAPT per Neurology recommendations Continue Pravastatin for ASCVD risk reduction

## 2022-01-30 NOTE — Patient Instructions (Addendum)
Thank you for coming to the office today.  Flu Shot today  Medications have 6 months of refills through July 2024   Your provider would like to you have your annual eye exam. Please contact your current eye doctor or here are some good options for you to contact.   [facility]   Address: [address] [place], [place] [address] Phone: [phone]  Website: [email or web address] [address] [place], [place] [address] Phone: [phone] [email or web address]  [facility]  Address: [place], [place], [place] [address] Phone: [phone]   [facility] [address] [place], [place] [address] Phone: [phone]  [facility] Address: [place], [place], [place] [address]  Phone: [phone]   DUE for [place] (no food or drink after midnight before the lab appointment, only water or coffee without cream/sugar on the morning of)  SCHEDULE "Lab Only" visit in the morning at the clinic for lab draw in 1 Day Friday 1/26  - Make sure Lab Only appointment is at about 1 week before your next appointment, so that results will be available  For Lab Results, once available within 2-3 days of blood draw, you can can log in to MyChart online to view your results and a brief explanation. Also, we can discuss results at next follow-up visit.  Please schedule a Follow-up Appointment to: Return for Return 1 day Friday 1/26 at 915am for [facility] only - Return 1 Year for Annual.  If you have any other questions or concerns, please feel free to call the office or send a message through [place]. You may also schedule an earlier appointment if necessary.  Additionally, you may be receiving a survey about your experience at our office within a few days to 1 week by e-mail or mail. We value your feedback.  Nobie Putnam, DO [place]

## 2022-01-30 NOTE — Assessment & Plan Note (Signed)
Well controlled cholesterol panel on current statin Previous changed from pravastatin to rosuvastatin Return tomorrow for fasting lipid Elevated ASCVD risk with prior CVA  Plan: 1. Continue current meds - Rosuvastatin '20mg'$  daily refilled 2. Continue Plavix 75 + ASA 81 secondary ASCVD risk reduction 3. Encourage improved lifestyle - low carb/cholesterol, reduce portion size, continue improving regular exercise

## 2022-01-30 NOTE — Progress Notes (Signed)
Subjective:    Patient ID: [name], male    DOB: October 21, 1960, 62 y.o.   MRN: [id number]  [name] is a 61 y.o. male presenting on 01/30/2022 for No chief complaint on file.   HPI  [place]e Language Interpretation on phone [place] [phone]  History provided by patient in [place] and interpretation on phone   Here for Annual Physical and Fasting lab visit TOMORROW. Not fasting today.   CHRONIC DM, Type 2 w/ CKD-3b A1c has been controlled Followed by Nephrology Due for labs today Similar CBGs: Avg 111-130s, Low none < 100, High < 150. Checks CBGs x 1 daily Meds: Metformin '500mg'$  BID Reports good compliance. Tolerating well w/o side-effects Currently on ACEi on Lisinopril 2.'5mg'$  daily Taking Statin and on anti-platelet Lifestyle: - Regarding hydration, he says drinks 1-2 cups water a day, not hydrated well. - Diet (Improved diet overall, tries to maintain balanced diet, tries to limit sugars) - Exercise (Stays active working at [employer], but does not do regular exercise) - Followed by [employer], [facility] - last visit 1 year ago - he is due to schedule Annual DM Eye Visit Denies hypoglycemia   CHRONIC HTN:  Reports he checks BP at home, 120/80s on average. Mostly unchanged since previous visit. See above CKD-III Followed by [facility] Dr Holley Raring Current Meds - Carvedilol 3.'125mg'$  HALF tab BID, Lisinopril 2.'5mg'$  daily Reports good compliance, took meds today. Tolerating well, w/o complaints.   HYPERLIPIDEMIA: - Reports no concerns. Last lipid panel 2022 controlled - Currently taking Rosuvastatin '20mg'$  daily, tolerating well without side effects or myalgias   History of CVA, without significant residual deficit - Reported prior history of CVA in 2013, has done well since without any significant residual deficits, he does have history of slight facial asymmetry but no functional deficit with weakness or sensation change. Previously taking Aggrenox for  stroke risk reduction. [name] switched off Aggrenox and started on Plavix '75mg'$  daily and Aspirin '81mg'$  daily Today he continues to do well on combination of Plavix '75mg'$  daily and ASA '81mg'$  daily   History of thyroid nodule Normal TSH lab. Due today   Health Maintenance:   UTD COVID Vaccine   Due for Flu Shot, will receive today    Colon CA Screening: Last Colon cancer screening with Cologuard 07/24/16 POSITIVE, then referred for Colonoscopy (done by Dr Allen Norris [facility]), results with x 1 polyp hyperplastic benign, good for 10 years, done 12/02/16 - next due in 2028. Currently asymptomatic. No known family history of colon CA.   - Prostate CA Screening: No known personal or family history of prostate CA. Prior PSA reported normal. Last PSA 0.9 (2021) due for repeat. Asymptomatic prostate   - UTD routine Hepatitis C screen was negative 07/2016. Declines HIV - UTD Pneumovax-23, in DM, now next due age 60 for Prevnar-13      01/30/2022    8:28 AM 12/23/2019    9:42 AM 02/01/2019    9:24 AM  Depression screen PHQ 2/9  Decreased Interest 0 0 0  Down, Depressed, Hopeless 0 0 0  PHQ - 2 Score 0 0 0    Past Medical History:  Diagnosis Date   Cyst of thyroid 2013   right   Hyperlipidemia    Kidney stones    Stroke [facility]) 2013   Past Surgical History:  Procedure Laterality Date   APPENDECTOMY  2003   COLONOSCOPY WITH PROPOFOL N/A 12/02/2016   Procedure: COLONOSCOPY WITH PROPOFOL;  Surgeon: Lucilla Lame, MD;  Location[facility] END[facility]COPY;  Service: Endoscopy;  Laterality: N/A;   FINE NEEDLE ASPIRATION Right 09-28-13   thyroid nodule/BENIGN FOLLICULAR CELLS, COLLOID   Social History   Socioeconomic History   Marital status: Married    Spouse name: Not on file   Number of children: Not on file   Years of education: Not on file   Highest education level: Not on file  Occupational History   Not on file  Tobacco Use   Smoking status: Never   Smokeless tobacco: Never  Substance  and Sexual Activity   Alcohol use: No   Drug use: No   Sexual activity: Not on file  Other Topics Concern   Not on file  Social History Narrative   Not on file   Social Determinants of Health   Financial Resource Strain: Not on file  Food Insecurity: Not on file  Transportation Needs: Not on file  Physical Activity: Not on file  Stress: Not on file  Social Connections: Not on file  Intimate Partner Violence: Not on file   Family History  Family history unknown: Yes   Current Outpatient Medications on File Prior to Visit  Medication Sig   BAYER ASPIRIN PO Take 81 mg by mouth.   carvedilol (COREG) 3.125 MG tablet Take 0.5 tablets (1.5625 mg total) by mouth 2 (two) times daily.   clopidogrel (PLAVIX) 75 MG tablet Take 1 tablet (75 mg total) by mouth daily.   lisinopril (ZESTRIL) 2.5 MG tablet Take 1 tablet (2.5 mg total) by mouth daily.   metFORMIN (GLUCOPHAGE) 500 MG tablet Take 1 tablet (500 mg total) by mouth 2 (two) times daily with a meal.   rosuvastatin (CRESTOR) 20 MG tablet Take 1 tablet (20 mg total) by mouth daily.   No current facility-administered medications on file prior to visit.    Review of Systems  Constitutional:  Negative for activity change, appetite change, chills, diaphoresis, fatigue and fever.  HENT:  Negative for congestion and hearing loss.   Eyes:  Negative for visual disturbance.  Respiratory:  Negative for cough, chest tightness, shortness of breath and wheezing.   Cardiovascular:  Negative for chest pain, palpitations and leg swelling.  Gastrointestinal:  Negative for abdominal pain, constipation, diarrhea, nausea and vomiting.  Genitourinary:  Negative for dysuria, frequency and hematuria.  Musculoskeletal:  Negative for arthralgias and neck pain.  Skin:  Negative for rash.  Neurological:  Negative for dizziness, weakness, light-headedness, numbness and headaches.  Hematological:  Negative for adenopathy.  Psychiatric/Behavioral:  Negative  for behavioral problems, dysphoric mood and sleep disturbance.    Per HPI unless specifically indicated above      Objective:    BP 120/84 (BP Location: Left Arm, Patient Position: Sitting, Cuff Size: Normal)   Pulse 83   Temp (!) 97.1 F (36.2 C) (Temporal)   Wt 147 lb (66.7 kg)   SpO2 99%   BMI 23.73 kg/m   Wt Readings from Last 3 Encounters:  01/30/22 147 lb (66.7 kg)  12/26/20 146 lb 6.4 oz (66.4 kg)  12/23/19 139 lb (63 kg)    Physical Exam Vitals and nursing note reviewed.  Constitutional:      General: He is not in acute distress.    Appearance: He is well-developed. He is not diaphoretic.     Comments: Well-appearing, comfortable, cooperative  HENT:     Head: Normocephalic and atraumatic.  Eyes:     General:        Right  eye: No discharge.        Left eye: No discharge.     Conjunctiva/sclera: Conjunctivae normal.     Pupils: Pupils are equal, round, and reactive to light.  Neck:     Thyroid: No thyromegaly.     Vascular: No carotid bruit.  Cardiovascular:     Rate and Rhythm: Normal rate and regular rhythm.     Pulses: Normal pulses.     Heart sounds: Normal heart sounds. No murmur heard. Pulmonary:     Effort: Pulmonary effort is normal. No respiratory distress.     Breath sounds: Normal breath sounds. No wheezing or rales.  Abdominal:     General: Bowel sounds are normal. There is no distension.     Palpations: Abdomen is soft. There is no mass.     Tenderness: There is no abdominal tenderness.  Musculoskeletal:        General: No tenderness. Normal range of motion.     Cervical back: Normal range of motion and neck supple.     Right lower leg: No edema.     Left lower leg: No edema.     Comments: Upper / Lower Extremities: - Normal muscle tone, strength bilateral upper extremities 5/5, lower extremities 5/5  Lymphadenopathy:     Cervical: No cervical adenopathy.  Skin:    General: Skin is warm and dry.     Findings: No erythema or rash.   Neurological:     Mental Status: He is alert and oriented to person, place, and time.     Comments: Distal sensation intact to light touch all extremities  Psychiatric:        Mood and Affect: Mood normal.        Behavior: Behavior normal.        Thought Content: Thought content normal.     Comments: Well groomed, good eye contact, normal speech and thoughts     Diabetic Foot Exam - Simple   Simple Foot Form Diabetic Foot exam was performed with the following findings: Yes 01/30/2022  8:28 AM  Visual Inspection No deformities, no ulcerations, no other skin breakdown bilaterally: Yes Sensation Testing Intact to touch and monofilament testing bilaterally: Yes Pulse Check Posterior Tibialis and Dorsalis pulse intact bilaterally: Yes Comments      Results for orders placed or performed in visit on 12/26/20  COMPLETE METABOLIC PANEL WITH GFR  Result Value Ref Range   Glucose, Bld 144 (H) 65 - 99 mg/dL   BUN 27 (H) 7 - 25 mg/dL   Creat 1.64 (H) 0.70 - 1.35 mg/dL   eGFR 47 (L) > OR = 60 mL/min/1.26m   BUN/Creatinine Ratio 16 6 - 22 (calc)   Sodium 141 135 - 146 mmol/L   Potassium 4.4 3.5 - 5.3 mmol/L   Chloride 106 98 - 110 mmol/L   CO2 27 20 - 32 mmol/L   Calcium 9.2 8.6 - 10.3 mg/dL   Total Protein 6.9 6.1 - 8.1 g/dL   Albumin 4.2 3.6 - 5.1 g/dL   Globulin 2.7 1.9 - 3.7 g/dL (calc)   AG Ratio 1.6 1.0 - 2.5 (calc)   Total Bilirubin 0.7 0.2 - 1.2 mg/dL   Alkaline phosphatase (APISO) 53 35 - 144 U/L   AST 18 10 - 35 U/L   ALT 21 9 - 46 U/L  Lipid panel  Result Value Ref Range   Cholesterol 94 <200 mg/dL   HDL 55 > OR = 40 mg/dL   Triglycerides 77 <150  mg/dL   LDL Cholesterol (Calc) 23 mg/dL (calc)   Total CHOL/HDL Ratio 1.7 <5.0 (calc)   Non-HDL Cholesterol (Calc) 39 <130 mg/dL (calc)  CBC with Differential/Platelet  Result Value Ref Range   WBC 5.7 3.8 - 10.8 Thousand/uL   RBC 4.24 4.20 - 5.80 Million/uL   Hemoglobin 12.7 (L) 13.2 - 17.1 g/dL   HCT 39.7 38.5 -  50.0 %   MCV 93.6 80.0 - 100.0 fL   MCH 30.0 27.0 - 33.0 pg   MCHC 32.0 32.0 - 36.0 g/dL   RDW 12.6 11.0 - 15.0 %   Platelets 220 140 - 400 Thousand/uL   MPV 11.7 7.5 - 12.5 fL   Neutro Abs 3,409 1,500 - 7,800 cells/uL   Lymphs Abs 1,499 850 - 3,900 cells/uL   Absolute Monocytes 502 200 - 950 cells/uL   Eosinophils Absolute 222 15 - 500 cells/uL   Basophils Absolute 68 0 - 200 cells/uL   Neutrophils Relative % 59.8 %   Total Lymphocyte 26.3 %   Monocytes Relative 8.8 %   Eosinophils Relative 3.9 %   Basophils Relative 1.2 %  Hemoglobin A1c  Result Value Ref Range   Hgb A1c MFr Bld 7.1 (H) <5.7 % of total Hgb   Mean Plasma Glucose 157 mg/dL   eAG (mmol/L) 8.7 mmol/L  PSA  Result Value Ref Range   PSA 0.79 < OR = 4.00 ng/mL  TSH  Result Value Ref Range   TSH 2.09 0.40 - 4.50 mIU/L      Assessment & Plan:   Problem List Items Addressed This Visit     Benign hypertension with CKD (chronic kidney disease) stage III (HCC)    Well-controlled HTN - Home BP readings normal  History of CVA Complication with SFK-8L advancing w/ elevated Cr / reduced GFR Followed by Nephrology   Plan:  1 Continue current BP regimen back on Lisinopril 2.'5mg'$  daily, and Carvedilol 3.'125mg'$  HALF tab BID (dose =1.57) 2. Encourage improved lifestyle - low sodium diet, regular exercise 3. Continue monitor BP outside office, bring readings to next visit, if persistently >140/90 or new symptoms notify office sooner      CKD (chronic kidney disease), stage III ([place])    Followed by Nephrology [facility] now Concern with progression of CKD III to stage IIIb now  Plan - Improve hydration as discussed - Limit NSAIDs Continue back on ACEi 2.5 low dose      Relevant Orders   COMPLETE METABOLIC PANEL WITH GFR   CBC with Differential/Platelet   Controlled type 2 diabetes mellitus with diabetic nephropathy, without long-term current use of insulin (HCC)    Controlled DM A1c 7.1 previously, due for repeat No  known hypoglycemia or hyperglycemia at this time. Complications - Nephropathy with CKDIIIb now elevated Creatinine / reduced GFR now, other including hyperlipidemia specifically hypertriglyceridemia, and history of CVA - increases risk of future cardiovascular complications   Plan:  1. Continue current therapy - Metformin '500mg'$  BID - Future May consider oral GLP1 or injectable GLP1 in future or DPP4 2. Encourage improved lifestyle - low carb, low sugar diet, reduce portion size, continue improving regular exercise 3. Check CBG, bring log to next visit for review 4. Continue Plavix+ASA, ACEi Statin 5. Advised to schedule DM ophtho exam, send record Urine microalbumin today or tomorrow DM Foot exam today      Relevant Orders   Urine Microalbumin w/creat. ratio   Hemoglobin A1c   History of CVA (cerebrovascular accident)  Stable clinically without residual deficits from prior CVA Has some baseline slightly asymmetrical mouth possibly residual, without change History CVA 2013 Off Aggrenox due to cost  Plan Continue yearly f/u with [facility] On Plavix '75mg'$  and ASA '81mg'$  DAPT per Neurology recommendations Continue Pravastatin for ASCVD risk reduction      Hyperlipidemia associated with type 2 diabetes mellitus ([place])    Well controlled cholesterol panel on current statin Previous changed from pravastatin to rosuvastatin Return tomorrow for fasting lipid Elevated ASCVD risk with prior CVA  Plan: 1. Continue current meds - Rosuvastatin '20mg'$  daily refilled 2. Continue Plavix 75 + ASA 81 secondary ASCVD risk reduction 3. Encourage improved lifestyle - low carb/cholesterol, reduce portion size, continue improving regular exercise        Relevant Orders   COMPLETE METABOLIC PANEL WITH GFR   Lipid panel   TSH   Other Visit Diagnoses     Annual physical exam    -  Primary   Relevant Orders   COMPLETE METABOLIC PANEL WITH GFR   CBC with Differential/Platelet   Lipid panel    Hemoglobin A1c   PSA   TSH   Need for influenza vaccination       Relevant Orders   Flu Vaccine QUAD 6+ mos PF IM (Fluarix Quad PF) (Completed)   Essential hypertension       Relevant Orders   COMPLETE METABOLIC PANEL WITH GFR   CBC with Differential/Platelet   Nocturia       Relevant Orders   PSA   Needs flu shot          Updated Health Maintenance information Fasting labs tomorrow Encouraged improvement to lifestyle with diet and exercise Goal of weight loss  Flu Shot today.    Orders Placed This Encounter  Procedures   Flu Vaccine QUAD 6+ mos PF IM (Fluarix Quad PF)   Urine Microalbumin w/creat. ratio   COMPLETE METABOLIC PANEL WITH GFR    Standing Status:   Future    Standing Expiration Date:   05/07/2022   CBC with Differential/Platelet    Standing Status:   Future    Standing Expiration Date:   05/07/2022   Lipid panel    Standing Status:   Future    Standing Expiration Date:   05/07/2022    Order Specific Question:   Has the patient fasted?    Answer:   Yes   Hemoglobin A1c    Standing Status:   Future    Standing Expiration Date:   05/07/2022   PSA    Standing Status:   Future    Standing Expiration Date:   05/07/2022   TSH    Standing Status:   Future    Standing Expiration Date:   05/07/2022      No orders of the defined types were placed in this encounter.     Follow up plan: Return for Return 1 day Friday 1/26 at 915am for [facility] only - Return 1 Year for Annual.  Future labs for tomorrow 1/26 - labs [place], [facility] 01/30/2022, 8:13 AM

## 2022-01-30 NOTE — Assessment & Plan Note (Signed)
Well-controlled HTN - Home BP readings normal  History of CVA Complication with JYL-1E advancing w/ elevated Cr / reduced GFR Followed by Nephrology   Plan:  1 Continue current BP regimen back on Lisinopril 2.'5mg'$  daily, and Carvedilol 3.'125mg'$  HALF tab BID (dose =1.57) 2. Encourage improved lifestyle - low sodium diet, regular exercise 3. Continue monitor BP outside office, bring readings to next visit, if persistently >140/90 or new symptoms notify office sooner

## 2022-01-30 NOTE — Assessment & Plan Note (Addendum)
Controlled DM A1c 7.1 previously, due for repeat No known hypoglycemia or hyperglycemia at this time. Complications - Nephropathy with CKDIIIb now elevated Creatinine / reduced GFR now, other including hyperlipidemia specifically hypertriglyceridemia, and history of CVA - increases risk of future cardiovascular complications   Plan:  1. Continue current therapy - Metformin '500mg'$  BID - Future May consider oral GLP1 or injectable GLP1 in future or DPP4 2. Encourage improved lifestyle - low carb, low sugar diet, reduce portion size, continue improving regular exercise 3. Check CBG, bring log to next visit for review 4. Continue Plavix+ASA, ACEi Statin 5. Advised to schedule DM ophtho exam, send record Urine microalbumin today or tomorrow DM Foot exam today

## 2022-01-30 NOTE — Assessment & Plan Note (Signed)
Followed by Nephrology [facility] now Concern with progression of CKD III to stage IIIb now  Plan - Improve hydration as discussed - Limit NSAIDs Continue back on ACEi 2.5 low dose

## 2022-01-31 ENCOUNTER — Other Ambulatory Visit: Payer: 59

## 2022-01-31 DIAGNOSIS — I10 Essential (primary) hypertension: Secondary | ICD-10-CM | POA: Diagnosis not present

## 2022-01-31 DIAGNOSIS — Z0000 Encounter for general adult medical examination without abnormal findings: Secondary | ICD-10-CM | POA: Diagnosis not present

## 2022-01-31 DIAGNOSIS — E1121 Type 2 diabetes mellitus with diabetic nephropathy: Secondary | ICD-10-CM | POA: Diagnosis not present

## 2022-01-31 DIAGNOSIS — E785 Hyperlipidemia, unspecified: Secondary | ICD-10-CM | POA: Diagnosis not present

## 2022-01-31 DIAGNOSIS — N1832 Chronic kidney disease, stage 3b: Secondary | ICD-10-CM | POA: Diagnosis not present

## 2022-01-31 DIAGNOSIS — R351 Nocturia: Secondary | ICD-10-CM | POA: Diagnosis not present

## 2022-01-31 DIAGNOSIS — E1169 Type 2 diabetes mellitus with other specified complication: Secondary | ICD-10-CM | POA: Diagnosis not present

## 2022-02-01 LAB — CBC WITH DIFFERENTIAL/PLATELET
Absolute Monocytes: 623 cells/uL (ref 200–950)
Basophils Absolute: 47 cells/uL (ref 0–200)
Basophils Relative: 0.7 %
Eosinophils Absolute: 127 cells/uL (ref 15–500)
Eosinophils Relative: 1.9 %
HCT: 39.4 % (ref 38.5–50.0)
Hemoglobin: 12.6 g/dL — ABNORMAL LOW (ref 13.2–17.1)
Lymphs Abs: 1260 cells/uL (ref 850–3900)
MCH: 29.6 pg (ref 27.0–33.0)
MCHC: 32 g/dL (ref 32.0–36.0)
MCV: 92.7 fL (ref 80.0–100.0)
MPV: 11.7 fL (ref 7.5–12.5)
Monocytes Relative: 9.3 %
Neutro Abs: 4643 cells/uL (ref 1500–7800)
Neutrophils Relative %: 69.3 %
Platelets: 185 10*3/uL (ref 140–400)
RBC: 4.25 10*6/uL (ref 4.20–5.80)
RDW: 12.9 % (ref 11.0–15.0)
Total Lymphocyte: 18.8 %
WBC: 6.7 10*3/uL (ref 3.8–10.8)

## 2022-02-01 LAB — LIPID PANEL
Cholesterol: 99 mg/dL (ref <200)
HDL: 53 mg/dL (ref >=40)
LDL Cholesterol (Calc): 26 mg/dL (calc)
Non-HDL Cholesterol (Calc): 46 mg/dL (calc) (ref <130)
Total CHOL/HDL Ratio: 1.9 (calc) (ref <5.0)
Triglycerides: 113 mg/dL (ref <150)

## 2022-02-01 LAB — COMPLETE METABOLIC PANEL WITH GFR
AG Ratio: 1.5 (calc) (ref 1.0–2.5)
ALT: 16 U/L (ref 9–46)
AST: 16 U/L (ref 10–35)
Albumin: 4.2 g/dL (ref 3.6–5.1)
Alkaline phosphatase (APISO): 48 U/L (ref 35–144)
BUN/Creatinine Ratio: 14 (calc) (ref 6–22)
BUN: 20 mg/dL (ref 7–25)
CO2: 27 mmol/L (ref 20–32)
Calcium: 8.7 mg/dL (ref 8.6–10.3)
Chloride: 108 mmol/L (ref 98–110)
Creat: 1.46 mg/dL — ABNORMAL HIGH (ref 0.70–1.35)
Globulin: 2.8 g/dL (calc) (ref 1.9–3.7)
Glucose, Bld: 141 mg/dL — ABNORMAL HIGH (ref 65–99)
Potassium: 4 mmol/L (ref 3.5–5.3)
Sodium: 143 mmol/L (ref 135–146)
Total Bilirubin: 0.9 mg/dL (ref 0.2–1.2)
Total Protein: 7 g/dL (ref 6.1–8.1)
eGFR: 54 mL/min/{1.73_m2} — ABNORMAL LOW (ref >=60)

## 2022-02-01 LAB — HEMOGLOBIN A1C
Hgb A1c MFr Bld: 7.5 % of total Hgb — ABNORMAL HIGH (ref <5.7)
Mean Plasma Glucose: 169 mg/dL
eAG (mmol/L): 9.3 mmol/L

## 2022-02-01 LAB — MICROALBUMIN / CREATININE URINE RATIO
Creatinine, Urine: 118 mg/dL (ref 20–320)
Microalb Creat Ratio: 16 mcg/mg creat (ref <30)
Microalb, Ur: 1.9 mg/dL

## 2022-02-01 LAB — TSH: TSH: 1.58 mIU/L (ref 0.40–4.50)

## 2022-02-01 LAB — PSA: PSA: 0.72 ng/mL (ref <=4.00)

## 2022-06-20 ENCOUNTER — Other Ambulatory Visit: Payer: Self-pay | Admitting: Family Medicine

## 2022-06-20 DIAGNOSIS — Z8673 Personal history of transient ischemic attack (TIA), and cerebral infarction without residual deficits: Secondary | ICD-10-CM

## 2022-06-20 DIAGNOSIS — E1169 Type 2 diabetes mellitus with other specified complication: Secondary | ICD-10-CM

## 2022-06-20 DIAGNOSIS — I10 Essential (primary) hypertension: Secondary | ICD-10-CM

## 2022-06-20 DIAGNOSIS — E1121 Type 2 diabetes mellitus with diabetic nephropathy: Secondary | ICD-10-CM

## 2022-06-20 DIAGNOSIS — E119 Type 2 diabetes mellitus without complications: Secondary | ICD-10-CM

## 2022-06-20 NOTE — Telephone Encounter (Signed)
Medication Refill - Medication:  carvedilol (COREG) 3.125 MG tablet  clopidogrel (PLAVIX) 75 MG tablet  lisinopril (ZESTRIL) 2.5 MG tablet  metFORMIN (GLUCOPHAGE) 500 MG tablet  rosuvastatin (CRESTOR) 20 MG tablet   Has the patient contacted their pharmacy? No. (Agent: If no, request that the patient contact the pharmacy for the refill. If patient does not wish to contact the pharmacy document the reason why and proceed with request.) (Agent: If yes, when and what did the pharmacy advise?)  Preferred Pharmacy (with phone number or street name):  [id number] [place], [place] - [address] AT AT [address] Phone: [phone]  Fax: [phone]     Has the patient been seen for an appointment in the last year OR does the patient have an upcoming appointment? Yes.    Agent: Please be advised that RX refills may take up to 3 business days. We ask that you follow-up with your pharmacy.  Patient is requesting 90 day supplies.

## 2022-06-23 NOTE — Telephone Encounter (Signed)
Pt should have a refill - need to call pharmacy.

## 2022-06-23 NOTE — Telephone Encounter (Signed)
Unable to refill per protocol, Rx request is too soon, last refill 07/11/21 for 90 and 3 refills. [facility] [place] and [facility] refills available. Patient needs to contact pharmacy.  Requested Prescriptions  Pending Prescriptions Disp Refills   carvedilol (COREG) 3.125 MG tablet 90 tablet 3    Sig: Take 0.5 tablets (1.5625 mg total) by mouth 2 (two) times daily.     Cardiovascular: Beta Blockers 3 Failed - 06/20/2022  5:30 PM      Failed - Cr in normal range and within 360 days    Creat  Date Value Ref Range Status  01/31/2022 1.46 (H) 0.70 - 1.35 mg/dL Final   Creatinine, Urine  Date Value Ref Range Status  01/31/2022 118 20 - 320 mg/dL Final         Passed - AST in normal range and within 360 days    AST  Date Value Ref Range Status  01/31/2022 16 10 - 35 U/L Final   SGOT(AST)  Date Value Ref Range Status  10/19/2011 20 15 - 37 Unit/L Final         Passed - ALT in normal range and within 360 days    ALT  Date Value Ref Range Status  01/31/2022 16 9 - 46 U/L Final   SGPT (ALT)  Date Value Ref Range Status  10/19/2011 23 12 - 78 U/L Final         Passed - Last BP in normal range    BP Readings from Last 1 Encounters:  01/30/22 120/84         Passed - Last Heart Rate in normal range    Pulse Readings from Last 1 Encounters:  01/30/22 83         Passed - Valid encounter within last 6 months    Recent Outpatient Visits           4 months ago Annual physical exam   [place] [facility] [place], Netta Neat, DO   1 year ago Annual physical exam   [place] [facility] Smitty Cords, DO   2 years ago Annual physical exam   [place] [facility] Smitty Cords, DO   3 years ago Annual physical exam   [place] [facility] Smitty Cords, DO   4 years ago Annual physical exam   [facility], Netta Neat, DO                clopidogrel (PLAVIX) 75 MG tablet 90 tablet 3    Sig: Take 1 tablet (75 mg total) by mouth daily.     Hematology: Antiplatelets - clopidogrel Failed - 06/20/2022  5:30 PM      Failed - HGB in normal range and within 180 days    Hemoglobin  Date Value Ref Range Status  01/31/2022 12.6 (L) 13.2 - 17.1 g/dL Final   HGB  Date Value Ref Range Status  10/19/2011 13.4 13.0 - 18.0 g/dL Final         Failed - Cr in normal range and within 360 days    Creat  Date Value Ref Range Status  01/31/2022 1.46 (H) 0.70 - 1.35 mg/dL Final   Creatinine, Urine  Date Value Ref Range Status  01/31/2022 118 20 - 320 mg/dL Final         Passed - HCT in normal range and within 180 days  HCT  Date Value Ref Range Status  01/31/2022 39.4 38.5 - 50.0 % Final  10/19/2011 41.1 40.0 - 52.0 % Final         Passed - PLT in normal range and within 180 days    Platelets  Date Value Ref Range Status  01/31/2022 185 140 - 400 Thousand/uL Final   Platelet  Date Value Ref Range Status  10/19/2011 251 150 - 440 x10 3/mm 3 Final         Passed - Valid encounter within last 6 months    Recent Outpatient Visits           4 months ago Annual physical exam   [facility] Smit[facility] DO   1 year ago Annual physical exam   [place] [facility] Smitty Cords, DO   2 years ago Annual physical exam   [place] [facility] Smitty [facility], DO   3 years ago Annual physical exam   [place] [facility] Smitty Cords, DO   4 years ago Annual physical exam   [place] [facility], Netta Neat, DO               lisinopril (ZESTRIL) 2.5 MG tablet 90 tablet 3    Sig: Take 1 tablet (2.5 mg total) by mouth daily.     Cardiovascular:  ACE Inhibitors Failed - 06/20/2022  5:30 PM      Failed - Cr in normal range and within 180 days    Creat   Date Value Ref Range Status  01/31/2022 1.46 (H) 0.70 - 1.35 mg/dL Final   Creatinine, Urine  Date Value Ref Range Status  01/31/2022 118 20 - 320 mg/dL Final         Passed - K in normal range and within 180 days    Potassium  Date Value Ref Range Status  01/31/2022 4.0 3.5 - 5.3 mmol/L Final  10/20/2011 3.9 3.5 - 5.1 mmol/L Final         Passed - Patient is not pregnant      Passed - Last BP in normal range    BP Readings from Last 1 Encounters:  01/30/22 120/84         Passed - Valid encounter within last 6 months    Recent Outpatient Visits           4 months ago Annual physical exam   [place] [facility] Smitty Cords, DO   1 year ago Annual physical exam   [place] [facility] Smitty Cords, DO   2 years ago Annual physical exam   [place] [facility] Smitty Cords, DO   3 years ago Annual physical exam    [facility] [place], Netta Neat, DO   4 years ago Annual physical exam    [facility] [place], Netta Neat, DO               metFORMIN (GLUCOPHAGE) 500 MG tablet 180 tablet 3    Sig: Take 1 tablet (500 mg total) by mouth 2 (two) times daily with a meal.     Endocrinology:  Diabetes - Biguanides Failed - 06/20/2022  5:30 PM      Failed - Cr in normal range and within 360 days    Creat  Date Value  Ref Range Status  01/31/2022 1.46 (H) 0.70 - 1.35 mg/dL Final   Creatinine, Urine  Date Value Ref Range Status  01/31/2022 118 20 - 320 mg/dL Final         Failed - eGFR in normal range and within 360 days    GFR, Est African American  Date Value Ref Range Status  12/23/2019 53 (L) > OR = 60 mL/min/1.66m2 Final   GFR, Est Non African American  Date Value Ref Range Status  12/23/2019 45 (L) > OR = 60 mL/min/1.35m2 Final   eGFR  Date Value Ref Range Status  01/31/2022 54 (L) > OR = 60 mL/min/1.75m2  Final         Failed - B12 Level in normal range and within 720 days    No results found for: "VITAMINB12"       Passed - HBA1C is between 0 and 7.9 and within 180 days    Hemoglobin A1C  Date Value Ref Range Status  10/03/2011 6.8 (H) 4.2 - 6.3 % Final    Comment:    The American Diabetes Association recommends that a primary goal of therapy should be <7% and that physicians should reevaluate the treatment regimen in patients with HbA1c values consistently >8%.    Hgb A1c MFr Bld  Date Value Ref Range Status  01/31/2022 7.5 (H) <5.7 % of total Hgb Final    Comment:    For someone without known diabetes, a hemoglobin A1c value of 6.5% or greater indicates that they may have  diabetes and this should be confirmed with a follow-up  test. . For someone with known diabetes, a value <7% indicates  that their diabetes is well controlled and a value  greater than or equal to 7% indicates suboptimal  control. A1c targets should be individualized based on  duration of diabetes, age, comorbid conditions, and  other considerations. . Currently, no consensus exists regarding use of hemoglobin A1c for diagnosis of diabetes for children. [name] - Valid encounter within last 6 months    Recent Outpatient Visits           4 months ago Annual physical exam   [place] [facility] [place], Netta Neat, DO   1 year ago Annual physical exam   [place] [facility] Smitty Cords, DO   2 years ago Annual physical exam   [place] [facility] Smitty Cords, DO   3 years ago Annual physical exam   [place] [facility] Smitty Cords, DO   4 years ago Annual physical exam   [place] [facility] [place], Netta Neat, DO              Passed - CBC within normal limits and completed in the last 12 months    WBC  Date Value Ref Range Status   01/31/2022 6.7 3.8 - 10.8 Thousand/uL Final   RBC  Date Value Ref Range Status  01/31/2022 4.25 4.20 - 5.80 Million/uL Final   Hemoglobin  Date Value Ref Range Status  01/31/2022 12.6 (L) 13.2 - 17.1 g/dL Final   HGB  Date Value Ref Range Status  10/19/2011 13.4 13.0 - 18.0 g/dL Final   HCT  Date Value Ref Range Status  01/31/2022 39.4 38.5 - 50.0 % Final  10/19/2011 41.1 40.0 - 52.0 % Final   MCHC  Date Value  Ref Range Status  01/31/2022 32.0 32.0 - 36.0 g/dL Final   [facility]  Date Value Ref Range Status  01/31/2022 29.6 27.0 - 33.0 pg Final   MCV  Date Value Ref Range Status  01/31/2022 92.7 80.0 - 100.0 fL Final  10/19/2011 90 80 - 100 fL Final   No results found for: "PLTCOUNTKUC", "LABPLAT", "POCPLA" RDW  Date Value Ref Range Status  01/31/2022 12.9 11.0 - 15.0 % Final  10/19/2011 12.8 11.5 - 14.5 % Final          rosuvastatin (CRESTOR) 20 MG tablet 90 tablet 3    Sig: Take 1 tablet (20 mg total) by mouth daily.     Cardiovascular:  Antilipid - Statins 2 Failed - 06/20/2022  5:30 PM      Failed - Cr in normal range and within 360 days    Creat  Date Value Ref Range Status  01/31/2022 1.46 (H) 0.70 - 1.35 mg/dL Final   Creatinine, Urine  Date Value Ref Range Status  01/31/2022 118 20 - 320 mg/dL Final         Failed - Lipid Panel in normal range within the last 12 months    Cholesterol  Date Value Ref Range Status  01/31/2022 99 <200 mg/dL Final  16/10/9602 540 0 - 200 mg/dL Final   Ldl Cholesterol, Calc  Date Value Ref Range Status  10/04/2011 41 0 - 100 mg/dL Final   LDL Cholesterol (Calc)  Date Value Ref Range Status  01/31/2022 26 mg/dL (calc) Final    Comment:    Reference range: <100 . Desirable range <100 mg/dL for primary prevention;   <70 mg/dL for patients with CHD or diabetic patients  with > or = 2 CHD risk factors. Marland Kitchen LDL-C is now calculated using the Martin-Hopkins  calculation, which is a validated novel method providing   better accuracy than the Friedewald equation in the  estimation of LDL-C.  Horald Pollen et al. Lenox Ahr. 9811;914(78): 2061-2068  (http://education.[employer].com/faq/FAQ164)    HDL Cholesterol  Date Value Ref Range Status  10/04/2011 43 40 - 60 mg/dL Final   HDL  Date Value Ref Range Status  01/31/2022 53 > OR = 40 mg/dL Final   Triglycerides  Date Value Ref Range Status  01/31/2022 113 <150 mg/dL Final  29/56/2130 865 0 - 200 mg/dL Final         Passed - Patient is not pregnant      Passed - Valid encounter within last 12 months    Recent Outpatient Visits           4 months ago Annual physical exam   [facility] Smitty Cords, DO   1 year ago Annual physical exam   [place] [facility] Smitty Cords, DO   2 years ago Annual physical exam   [place] [facility] Smitty Cords, DO   3 years ago Annual physical exam   [place] [facility] Smitty Cords, DO   4 years ago Annual physical exam   [place] [facility] [place], Netta Neat, [place]

## 2022-07-24 ENCOUNTER — Other Ambulatory Visit: Payer: Self-pay | Admitting: Family Medicine

## 2022-07-24 DIAGNOSIS — E1121 Type 2 diabetes mellitus with diabetic nephropathy: Secondary | ICD-10-CM

## 2022-07-24 NOTE — Telephone Encounter (Signed)
Requested Prescriptions  Pending Prescriptions Disp Refills   metFORMIN (GLUCOPHAGE) 500 MG tablet [Pharmacy Med Name: METFORMIN HCL 500 MG TABLET] 180 tablet 0    Sig: TAKE ONE TABLET BY MOUTH TWICE A DAY     Endocrinology:  Diabetes - Biguanides Failed - 07/24/2022  2:15 AM      Failed - Cr in normal range and within 360 days    Creat  Date Value Ref Range Status  01/31/2022 1.46 (H) 0.70 - 1.35 mg/dL Final   Creatinine, Urine  Date Value Ref Range Status  01/31/2022 118 20 - 320 mg/dL Final         Failed - eGFR in normal range and within 360 days    GFR, Est African American  Date Value Ref Range Status  12/23/2019 53 (L) > OR = 60 mL/min/1.9m2 Final   GFR, Est Non African American  Date Value Ref Range Status  12/23/2019 45 (L) > OR = 60 mL/min/1.8m2 Final   eGFR  Date Value Ref Range Status  01/31/2022 54 (L) > OR = 60 mL/min/1.18m2 Final         Failed - B12 Level in normal range and within 720 days    No results found for: "VITAMINB12"       Passed - HBA1C is between 0 and 7.9 and within 180 days    Hemoglobin A1C  Date Value Ref Range Status  10/03/2011 6.8 (H) 4.2 - 6.3 % Final    Comment:    The American Diabetes Association recommends that a primary goal of therapy should be <7% and that physicians should reevaluate the treatment regimen in patients with HbA1c values consistently >8%.    Hgb A1c MFr Bld  Date Value Ref Range Status  01/31/2022 7.5 (H) <5.7 % of total Hgb Final    Comment:    For someone without known diabetes, a hemoglobin A1c value of 6.5% or greater indicates that they may have  diabetes and this should be confirmed with a follow-up  test. . For someone with known diabetes, a value <7% indicates  that their diabetes is well controlled and a value  greater than or equal to 7% indicates suboptimal  control. A1c targets should be individualized based on  duration of diabetes, age, comorbid conditions, and  other  considerations. . Currently, no consensus exists regarding use of hemoglobin A1c for diagnosis of diabetes for children. Verna Czech - Valid encounter within last 6 months    Recent Outpatient Visits           5 months ago Annual physical exam   [place] [facility] Smitty Cords, DO   1 year ago Annual physical exam   [place] [facility] Smit[facility] Cords, DO   2 years ago Annual physical exam   [place] [facility] Smitty Cords, DO   3 years ago Annual physical exam   [place] [facility] Smitty Cords, DO   4 years ago Annual physical exam   [place] [facility] [place], Netta Neat, DO              Passed - CBC within normal limits and completed in the last 12 months    WBC  Date Value Ref Range Status  01/31/2022 6.7 3.8 - 10.8 Thousand/uL Final   RBC  Date Value Ref Range Status  01/31/2022 4.25 4.20 - 5.80 Million/uL Final   Hemoglobin  Date Value Ref Range Status  01/31/2022 12.6 (L) 13.2 - 17.1 g/dL Final   HGB  Date Value Ref Range Status  10/19/2011 13.4 13.0 - 18.0 g/dL Final   HCT  Date Value Ref Range Status  01/31/2022 39.4 38.5 - 50.0 % Final  10/19/2011 41.1 40.0 - 52.0 % Final   MCHC  Date Value Ref Range Status  01/31/2022 32.0 32.0 - 36.0 g/dL Final   [facility]  Date Value Ref Range Status  01/31/2022 29.6 27.0 - 33.0 pg Final   MCV  Date Value Ref Range Status  01/31/2022 92.7 80.0 - 100.0 fL Final  10/19/2011 90 80 - 100 fL Final   No results found for: "PLTCOUNTKUC", "LABPLAT", "POCPLA" RDW  Date Value Ref Range Status  01/31/2022 12.9 11.0 - 15.0 % Final  10/19/2011 12.8 11.5 - 14.5 % Final

## 2022-07-25 ENCOUNTER — Other Ambulatory Visit: Payer: Self-pay | Admitting: Family Medicine

## 2022-07-25 DIAGNOSIS — E119 Type 2 diabetes mellitus without complications: Secondary | ICD-10-CM

## 2022-07-25 DIAGNOSIS — I10 Essential (primary) hypertension: Secondary | ICD-10-CM

## 2022-07-25 DIAGNOSIS — E1169 Type 2 diabetes mellitus with other specified complication: Secondary | ICD-10-CM

## 2022-07-25 NOTE — Telephone Encounter (Signed)
Requested Prescriptions  Pending Prescriptions Disp Refills   lisinopril (ZESTRIL) 2.5 MG tablet [Pharmacy Med Name: LISINOPRIL 2.5 MG TABLET] 30 tablet 0    Sig: TAKE 1 TABLET BY MOUTH EVERY DAY     Cardiovascular:  ACE Inhibitors Failed - 07/25/2022  6:18 AM      Failed - Cr in normal range and within 180 days    Creat  Date Value Ref Range Status  01/31/2022 1.46 (H) 0.70 - 1.35 mg/dL Final   Creatinine, Urine  Date Value Ref Range Status  01/31/2022 118 20 - 320 mg/dL Final         Passed - K in normal range and within 180 days    Potassium  Date Value Ref Range Status  01/31/2022 4.0 3.5 - 5.3 mmol/L Final  10/20/2011 3.9 3.5 - 5.1 mmol/L Final         Passed - Patient is not pregnant      Passed - Last BP in normal range    BP Readings from Last 1 Encounters:  01/30/22 120/84         Passed - Valid encounter within last 6 months    Recent Outpatient Visits           5 months ago Annual physical exam   [place] [facility] Smitty Cords, DO   1 year ago Annual physical exam   [place] [facility] Smit[facility], DO   2 years ago Annual physical exam   [place] [facility] Smitty Cords, DO   3 years ago Annual physical exam   [facility] Smit[facility] C[facility], DO   4 years ago Annual physical exam   [place] [facility] [place], Netta Neat, DO               rosuvastatin (CRESTOR) 20 MG tablet [Pharmacy Med Name: ROSUVASTATIN CALCIUM 20 MG TAB] 30 tablet 5    Sig: TAKE 1 TABLET BY MOUTH EVERY DAY     Cardiovascular:  Antilipid - Statins 2 Failed - 07/25/2022  6:18 AM      Failed - Cr in normal range and within 360 days    Creat  Date Value Ref Range Status  01/31/2022 1.46 (H) 0.70 - 1.35 mg/dL Final   Creatinine, Urine  Date Value Ref Range Status  01/31/2022 118 20 - 320 mg/dL Final         Failed - Lipid  Panel in normal range within the last 12 months    Cholesterol  Date Value Ref Range Status  01/31/2022 99 <200 mg/dL Final  16/10/9602 540 0 - 200 mg/dL Final   Ldl Cholesterol, Calc  Date Value Ref Range Status  10/04/2011 41 0 - 100 mg/dL Final   LDL Cholesterol (Calc)  Date Value Ref Range Status  01/31/2022 26 mg/dL (calc) Final    Comment:    Reference range: <100 . Desirable range <100 mg/dL for primary prevention;   <70 mg/dL for patients with CHD or diabetic patients  with > or = 2 CHD risk factors. Marland Kitchen LDL-C is now calculated using the Martin-Hopkins  calculation, which is a validated novel method providing  better accuracy than the Friedewald equation in the  estimation of LDL-C.  Horald Pollen et al. Lenox Ahr. 9811;914(78): 2061-2068  (http://education.[facility].com/faq/FAQ164)    HDL Cholesterol  Date Value Ref Range Status  10/04/2011 43 40 - 60 mg/dL Final  HDL  Date Value Ref Range Status  01/31/2022 53 > OR = 40 mg/dL Final   Triglycerides  Date Value Ref Range Status  01/31/2022 113 <150 mg/dL Final  84/16/6063 016 0 - 200 mg/dL Final         Passed - Patient is not pregnant      Passed - Valid encounter within last 12 months    Recent Outpatient Visits           5 months ago Annual physical exam   [place] [facility] [place], Netta Neat, DO   1 year ago Annual physical exam   [place] [facility] Smitty Cords, DO   2 years ago Annual physical exam   [place] [facility] Smitty Cords, DO   3 years ago Annual physical exam   [place] [facility] Smitty Cords, DO   4 years ago Annual physical exam   [place] [facility] [place], Netta Neat, [place]

## 2022-07-29 ENCOUNTER — Other Ambulatory Visit: Payer: Self-pay | Admitting: Family Medicine

## 2022-07-29 DIAGNOSIS — Z8673 Personal history of transient ischemic attack (TIA), and cerebral infarction without residual deficits: Secondary | ICD-10-CM

## 2022-07-29 DIAGNOSIS — E1169 Type 2 diabetes mellitus with other specified complication: Secondary | ICD-10-CM

## 2022-07-29 DIAGNOSIS — E119 Type 2 diabetes mellitus without complications: Secondary | ICD-10-CM

## 2022-07-29 DIAGNOSIS — I10 Essential (primary) hypertension: Secondary | ICD-10-CM

## 2022-07-29 DIAGNOSIS — E1121 Type 2 diabetes mellitus with diabetic nephropathy: Secondary | ICD-10-CM

## 2022-07-29 NOTE — Telephone Encounter (Signed)
Medication Refill - Medication:   carvedilol (COREG) 3.125 MG tablet  lisinopril (ZESTRIL) 2.5 MG tablet  metFORMIN (GLUCOPHAGE) 500 MG tablet  rosuvastatin (CRESTOR) 20 MG tablet  clopidogrel (PLAVIX) 75 MG tablet   requesting 90 day supply on all medications , down to 1 - 2 of each, need refill asap, per daughter Adden Strout  Has the patient contacted their pharmacy? Yes, Pharmacy stated they have not received 90 day supply for ANY of these medications and advised patient to contact PCP office.   Preferred Pharmacy (with phone number or street name):  [id number] [place], [place] - [address] [address]  Phone: [phone] Fax: [phone]   Has the patient been seen for an appointment in the last year OR does the patient have an upcoming appointment? Yes.  Last appointment, CPE was on 1.25.2024

## 2022-07-30 MED ORDER — CLOPIDOGREL BISULFATE 75 MG PO TABS
75.0000 mg | ORAL_TABLET | Freq: Every day | ORAL | 1 refills | Status: DC
Start: 2022-07-30 — End: 2022-09-03

## 2022-07-30 MED ORDER — CARVEDILOL 3.125 MG PO TABS
1.5625 mg | ORAL_TABLET | Freq: Two times a day (BID) | ORAL | 1 refills | Status: DC
Start: 2022-07-30 — End: 2022-08-21

## 2022-07-30 MED ORDER — METFORMIN HCL 500 MG PO TABS
500.0000 mg | ORAL_TABLET | Freq: Two times a day (BID) | ORAL | 0 refills | Status: DC
Start: 2022-07-30 — End: 2022-09-10

## 2022-07-30 MED ORDER — ROSUVASTATIN CALCIUM 20 MG PO TABS
20.0000 mg | ORAL_TABLET | Freq: Every day | ORAL | 1 refills | Status: DC
Start: 2022-07-30 — End: 2022-09-03

## 2022-07-30 MED ORDER — LISINOPRIL 2.5 MG PO TABS
2.5000 mg | ORAL_TABLET | Freq: Every day | ORAL | 0 refills | Status: DC
Start: 2022-07-30 — End: 2022-09-03

## 2022-07-30 NOTE — Telephone Encounter (Signed)
Requested Prescriptions  Pending Prescriptions Disp Refills   carvedilol (COREG) 3.125 MG tablet 90 tablet 1    Sig: Take 0.5 tablets (1.5625 mg total) by mouth 2 (two) times daily.     Cardiovascular: Beta Blockers 3 Failed - 07/29/2022  3:57 PM      Failed - Cr in normal range and within 360 days    Creat  Date Value Ref Range Status  01/31/2022 1.46 (H) 0.70 - 1.35 mg/dL Final   Creatinine, Urine  Date Value Ref Range Status  01/31/2022 118 20 - 320 mg/dL Final         Passed - AST in normal range and within 360 days    AST  Date Value Ref Range Status  01/31/2022 16 10 - 35 U/L Final   SGOT(AST)  Date Value Ref Range Status  10/19/2011 20 15 - 37 Unit/L Final         Passed - ALT in normal range and within 360 days    ALT  Date Value Ref Range Status  01/31/2022 16 9 - 46 U/L Final   SGPT (ALT)  Date Value Ref Range Status  10/19/2011 23 12 - 78 U/L Final         Passed - Last BP in normal range    BP Readings from Last 1 Encounters:  01/30/22 120/84         Passed - Last Heart Rate in normal range    Pulse Readings from Last 1 Encounters:  01/30/22 83         Passed - Valid encounter within last 6 months    Recent Outpatient Visits           6 months ago Annual physical exam   [place] [facility] [place], Netta Neat, DO   1 year ago Annual physical exam   [facility] Smitty Cords, DO   2 years ago Annual physical exam   [place] [facility] Smitty Cords, DO   3 years ago Annual physical exam   [place] [facility] Smitty Cords, DO   5 years ago Annual physical exam    [facility] [place], Netta Neat, DO               lisinopril (ZESTRIL) 2.5 MG tablet 90 tablet 0    Sig: Take 1 tablet (2.5 mg total) by mouth daily.     Cardiovascular:  ACE Inhibitors Failed - 07/29/2022  3:57 PM       Failed - Cr in normal range and within 180 days    Creat  Date Value Ref Range Status  01/31/2022 1.46 (H) 0.70 - 1.35 mg/dL Final   Creatinine, Urine  Date Value Ref Range Status  01/31/2022 118 20 - 320 mg/dL Final         Passed - K in normal range and within 180 days    Potassium  Date Value Ref Range Status  01/31/2022 4.0 3.5 - 5.3 mmol/L Final  10/20/2011 3.9 3.5 - 5.1 mmol/L Final         Passed - Patient is not pregnant      Passed - Last BP in normal range    BP Readings from Last 1 Encounters:  01/30/22 120/84         Passed - Valid encounter within last 6 months    Recent Outpatient Visits  6 months ago Annual physical exam   [place] [facility] Smitty Cords, DO   1 year ago Annual physical exam   [place] [facility] Smitty Cords, DO   2 years ago Annual physical exam   [place] [facility] Smitty Cords, DO   3 years ago Annual physical exam   [place] [facility] Smitty Cords, DO   5 years ago Annual physical exam   [place] [facility] [place], Netta Neat, DO               metFORMIN (GLUCOPHAGE) 500 MG tablet 180 tablet 0    Sig: Take 1 tablet (500 mg total) by mouth 2 (two) times daily.     Endocrinology:  Diabetes - Biguanides Failed - 07/29/2022  3:57 PM      Failed - Cr in normal range and within 360 days    Creat  Date Value Ref Range Status  01/31/2022 1.46 (H) 0.70 - 1.35 mg/dL Final   Creatinine, Urine  Date Value Ref Range Status  01/31/2022 118 20 - 320 mg/dL Final         Failed - eGFR in normal range and within 360 days    GFR, Est African American  Date Value Ref Range Status  12/23/2019 53 (L) > OR = 60 mL/min/1.25m2 Final   GFR, Est Non African American  Date Value Ref Range Status  12/23/2019 45 (L) > OR = 60 mL/min/1.59m2 Final   eGFR  Date Value Ref Range  Status  01/31/2022 54 (L) > OR = 60 mL/min/1.67m2 Final         Failed - B12 Level in normal range and within 720 days    No results found for: "VITAMINB12"       Passed - HBA1C is between 0 and 7.9 and within 180 days    Hemoglobin A1C  Date Value Ref Range Status  10/03/2011 6.8 (H) 4.2 - 6.3 % Final    Comment:    The American Diabetes Association recommends that a primary goal of therapy should be <7% and that physicians should reevaluate the treatment regimen in patients with HbA1c values consistently >8%.    Hgb A1c MFr Bld  Date Value Ref Range Status  01/31/2022 7.5 (H) <5.7 % of total Hgb Final    Comment:    For someone without known diabetes, a hemoglobin A1c value of 6.5% or greater indicates that they may have  diabetes and this should be confirmed with a follow-up  test. . For someone with known diabetes, a value <7% indicates  that their diabetes is well controlled and a value  greater than or equal to 7% indicates suboptimal  control. A1c targets should be individualized based on  duration of diabetes, age, comorbid conditions, and  other considerations. . Currently, no consensus exists regarding use of hemoglobin A1c for diagnosis of diabetes for children. Verna Czech - Valid encounter within last 6 months    Recent Outpatient Visits           6 months ago Annual physical exam   [place] [facility] Smitty Cords, DO   1 year ago Annual physical exam   [facility] Smitty Cords, DO   2 years ago Annual physical exam   [place] [facility] [place], Netta Neat, DO   3 years ago Annual physical exam   [place] [facility] [place], Netta Neat, DO   5 years ago Annual physical exam   [place] [facility] [place], Netta Neat, DO              Passed - CBC within normal limits and completed in the last 12  months    WBC  Date Value Ref Range Status  01/31/2022 6.7 3.8 - 10.8 Thousand/uL Final   RBC  Date Value Ref Range Status  01/31/2022 4.25 4.20 - 5.80 Million/uL Final   Hemoglobin  Date Value Ref Range Status  01/31/2022 12.6 (L) 13.2 - 17.1 g/dL Final   HGB  Date Value Ref Range Status  10/19/2011 13.4 13.0 - 18.0 g/dL Final   HCT  Date Value Ref Range Status  01/31/2022 39.4 38.5 - 50.0 % Final  10/19/2011 41.1 40.0 - 52.0 % Final   MCHC  Date Value Ref Range Status  01/31/2022 32.0 32.0 - 36.0 g/dL Final   [facility]  Date Value Ref Range Status  01/31/2022 29.6 27.0 - 33.0 pg Final   MCV  Date Value Ref Range Status  01/31/2022 92.7 80.0 - 100.0 fL Final  10/19/2011 90 80 - 100 fL Final   No results found for: "PLTCOUNTKUC", "LABPLAT", "POCPLA" RDW  Date Value Ref Range Status  01/31/2022 12.9 11.0 - 15.0 % Final  10/19/2011 12.8 11.5 - 14.5 % Final          rosuvastatin (CRESTOR) 20 MG tablet 90 tablet 1    Sig: Take 1 tablet (20 mg total) by mouth daily.     Cardiovascular:  Antilipid - Statins 2 Failed - 07/29/2022  3:57 PM      Failed - Cr in normal range and within 360 days    Creat  Date Value Ref Range Status  01/31/2022 1.46 (H) 0.70 - 1.35 mg/dL Final   Creatinine, Urine  Date Value Ref Range Status  01/31/2022 118 20 - 320 mg/dL Final         Failed - Lipid Panel in normal range within the last 12 months    Cholesterol  Date Value Ref Range Status  01/31/2022 99 <200 mg/dL Final  40/98/1191 478 0 - 200 mg/dL Final   Ldl Cholesterol, Calc  Date Value Ref Range Status  10/04/2011 41 0 - 100 mg/dL Final   LDL Cholesterol (Calc)  Date Value Ref Range Status  01/31/2022 26 mg/dL (calc) Final    Comment:    Reference range: <100 . Desirable range <100 mg/dL for primary prevention;   <70 mg/dL for patients with CHD or diabetic patients  with > or = 2 CHD risk factors. Marland Kitchen LDL-C is now calculated using the Martin-Hopkins  calculation,  which is a validated novel method providing  better accuracy than the Friedewald equation in the  estimation of LDL-C.  Horald Pollen et al. Lenox Ahr. 2956;213(08): 2061-2068  (http://education.[employer].com/faq/FAQ164)    HDL Cholesterol  Date Value Ref Range Status  10/04/2011 43 40 - 60 mg/dL Final   HDL  Date Value Ref Range Status  01/31/2022 53 > OR = 40 mg/dL Final   Triglycerides  Date Value Ref Range Status  01/31/2022 113 <150 mg/dL Final  65/78/4696 295 0 - 200 mg/dL Final         Passed - Patient is not pregnant      Passed - Valid encounter within last  12 months    Recent Outpatient Visits           6 months ago Annual physical exam   [place] [facility] Smitty Cords, DO   1 year ago Annual physical exam   [place] [facility] Smitty Cords, DO   2 years ago Annual physical exam   [place] [facility] Smitty Cords, DO   3 years ago Annual physical exam   [place] [facility] Smitty Cords, DO   5 years ago Annual physical exam   [place] [facility] Smitty Cords, DO               clopidogrel (PLAVIX) 75 MG tablet 90 tablet 1    Sig: Take 1 tablet (75 mg total) by mouth daily.     Hematology: Antiplatelets - clopidogrel Failed - 07/29/2022  3:57 PM      Failed - HGB in normal range and within 180 days    Hemoglobin  Date Value Ref Range Status  01/31/2022 12.6 (L) 13.2 - 17.1 g/dL Final   HGB  Date Value Ref Range Status  10/19/2011 13.4 13.0 - 18.0 g/dL Final         Failed - Cr in normal range and within 360 days    Creat  Date Value Ref Range Status  01/31/2022 1.46 (H) 0.70 - 1.35 mg/dL Final   Creatinine, Urine  Date Value Ref Range Status  01/31/2022 118 20 - 320 mg/dL Final         Passed - HCT in normal range and within 180 days    HCT  Date Value Ref Range Status  01/31/2022  39.4 38.5 - 50.0 % Final  10/19/2011 41.1 40.0 - 52.0 % Final         Passed - PLT in normal range and within 180 days    Platelets  Date Value Ref Range Status  01/31/2022 185 140 - 400 Thousand/uL Final   Platelet  Date Value Ref Range Status  10/19/2011 251 150 - 440 x10 3/mm 3 Final         Passed - Valid encounter within last 6 months    Recent Outpatient Visits           6 months ago Annual physical exam   [place] [facility] Smitty Cords, DO   1 year ago Annual physical exam   [place] [facility] Smitty Cords, DO   2 years ago Annual physical exam   [place] [facility] Smitty Cords, DO   3 years ago Annual physical exam    [facility] Smitty Cords, DO   5 years ago Annual physical exam   [facility] [place], Netta Neat, [place]

## 2022-08-21 ENCOUNTER — Encounter: Payer: Self-pay | Admitting: Family Medicine

## 2022-08-21 DIAGNOSIS — I10 Essential (primary) hypertension: Secondary | ICD-10-CM

## 2022-08-21 MED ORDER — CARVEDILOL 3.125 MG PO TABS
3.1250 mg | ORAL_TABLET | Freq: Two times a day (BID) | ORAL | 1 refills | Status: DC
Start: 2022-08-21 — End: 2022-09-03

## 2022-09-03 ENCOUNTER — Other Ambulatory Visit: Payer: Self-pay

## 2022-09-03 DIAGNOSIS — I10 Essential (primary) hypertension: Secondary | ICD-10-CM

## 2022-09-03 DIAGNOSIS — Z8673 Personal history of transient ischemic attack (TIA), and cerebral infarction without residual deficits: Secondary | ICD-10-CM

## 2022-09-03 DIAGNOSIS — E785 Hyperlipidemia, unspecified: Secondary | ICD-10-CM

## 2022-09-03 DIAGNOSIS — E119 Type 2 diabetes mellitus without complications: Secondary | ICD-10-CM

## 2022-09-03 MED ORDER — ROSUVASTATIN CALCIUM 20 MG PO TABS
20.0000 mg | ORAL_TABLET | Freq: Every day | ORAL | 0 refills | Status: AC
Start: 2022-09-03 — End: ?

## 2022-09-03 MED ORDER — CARVEDILOL 3.125 MG PO TABS
3.1250 mg | ORAL_TABLET | Freq: Two times a day (BID) | ORAL | 0 refills | Status: AC
Start: 2022-09-03 — End: ?

## 2022-09-03 MED ORDER — LISINOPRIL 2.5 MG PO TABS
2.5000 mg | ORAL_TABLET | Freq: Every day | ORAL | 0 refills | Status: AC
Start: 2022-09-03 — End: ?

## 2022-09-03 MED ORDER — CLOPIDOGREL BISULFATE 75 MG PO TABS
75.0000 mg | ORAL_TABLET | Freq: Every day | ORAL | 0 refills | Status: AC
Start: 2022-09-03 — End: ?

## 2022-09-10 ENCOUNTER — Other Ambulatory Visit: Payer: Self-pay | Admitting: Family Medicine

## 2022-09-10 DIAGNOSIS — E1121 Type 2 diabetes mellitus with diabetic nephropathy: Secondary | ICD-10-CM

## 2022-09-10 NOTE — Telephone Encounter (Unsigned)
Copied from CRM [id number]. Topic: General - Other >> Sep 10, 2022  1:29 PM Everette C wrote: Reason for CRM: Medication Refill - Medication: metFORMIN (GLUCOPHAGE) 500 MG tablet [[id number]]  Has the patient contacted their pharmacy? Yes.   (Agent: If no, request that the patient contact the pharmacy for the refill. If patient does not wish to contact the pharmacy document the reason why and proceed with request.) (Agent: If yes, when and what did the pharmacy advise?)  Preferred Pharmacy (with phone number or street name): [facility] [place], [place] - [address] Portal to [facility] [address] [place] [address] Phone: [phone] Fax: [phone] Hours: Not open 24 hours   Has the patient been seen for an appointment in the last year OR does the patient have an upcoming appointment? Yes.    Agent: Please be advised that RX refills may take up to 3 business days. We ask that you follow-up with your pharmacy.

## 2022-09-11 MED ORDER — METFORMIN HCL 500 MG PO TABS
500.0000 mg | ORAL_TABLET | Freq: Two times a day (BID) | ORAL | 0 refills | Status: AC
Start: 2022-09-11 — End: ?

## 2022-09-11 NOTE — Telephone Encounter (Signed)
Change of pharmacy  Requested Prescriptions  Pending Prescriptions Disp Refills   metFORMIN (GLUCOPHAGE) 500 MG tablet 180 tablet 0    Sig: Take 1 tablet (500 mg total) by mouth 2 (two) times daily.     Endocrinology:  Diabetes - Biguanides Failed - 09/10/2022  2:14 PM      Failed - Cr in normal range and within 360 days    Creat  Date Value Ref Range Status  01/31/2022 1.46 (H) 0.70 - 1.35 mg/dL Final   Creatinine, Urine  Date Value Ref Range Status  01/31/2022 118 20 - 320 mg/dL Final         Failed - HBA1C is between 0 and 7.9 and within 180 days    Hemoglobin A1C  Date Value Ref Range Status  10/03/2011 6.8 (H) 4.2 - 6.3 % Final    Comment:    The American Diabetes Association recommends that a primary goal of therapy should be <7% and that physicians should reevaluate the treatment regimen in patients with HbA1c values consistently >8%.    Hgb A1c MFr Bld  Date Value Ref Range Status  01/31/2022 7.5 (H) <5.7 % of total Hgb Final    Comment:    For someone without known diabetes, a hemoglobin A1c value of 6.5% or greater indicates that they may have  diabetes and this should be confirmed with a follow-up  test. . For someone with known diabetes, a value <7% indicates  that their diabetes is well controlled and a value  greater than or equal to 7% indicates suboptimal  control. A1c targets should be individualized based on  duration of diabetes, age, comorbid conditions, and  other considerations. . Currently, no consensus exists regarding use of hemoglobin A1c for diagnosis of diabetes for children. .          Failed - eGFR in normal range and within 360 days    GFR, Est African American  Date Value Ref Range Status  12/23/2019 53 (L) > OR = 60 mL/min/1.18m2 Final   GFR, Est Non African American  Date Value Ref Range Status  12/23/2019 45 (L) > OR = 60 mL/min/1.75m2 Final   eGFR  Date Value Ref Range Status  01/31/2022 54 (L) > OR = 60 mL/min/1.59m2  Final         Failed - B12 Level in normal range and within 720 days    No results found for: "VITAMINB12"       Failed - Valid encounter within last 6 months    Recent Outpatient Visits           7 months ago Annual physical exam   [place] [facility] Smitty Cords, DO   1 year ago Annual physical exam   [place] [facility] Smitty Cords, DO   2 years ago Annual physical exam   [place] [facility] Smitty Cords, DO   3 years ago Annual physical exam   [place] [facility] Smitty Cords, DO   5 years ago Annual physical exam    [facility] [place], Netta Neat, DO              Passed - CBC within normal limits and completed in the last 12 months    WBC  Date Value Ref Range Status  01/31/2022 6.7 3.8 - 10.8 Thousand/uL Final   RBC  Date Value Ref Range Status  01/31/2022 4.25 4.20 - 5.80 Million/uL Final   Hemoglobin  Date Value Ref Range Status  01/31/2022 12.6 (L) 13.2 - 17.1 g/dL Final   HGB  Date Value Ref Range Status  10/19/2011 13.4 13.0 - 18.0 g/dL Final   HCT  Date Value Ref Range Status  01/31/2022 39.4 38.5 - 50.0 % Final  10/19/2011 41.1 40.0 - 52.0 % Final   MCHC  Date Value Ref Range Status  01/31/2022 32.0 32.0 - 36.0 g/dL Final   [facility]  Date Value Ref Range Status  01/31/2022 29.6 27.0 - 33.0 pg Final   MCV  Date Value Ref Range Status  01/31/2022 92.7 80.0 - 100.0 fL Final  10/19/2011 90 80 - 100 fL Final   No results found for: "PLTCOUNTKUC", "LABPLAT", "POCPLA" RDW  Date Value Ref Range Status  01/31/2022 12.9 11.0 - 15.0 % Final  10/19/2011 12.8 11.5 - 14.5 % Final

## 2022-10-01 ENCOUNTER — Encounter: Payer: Self-pay | Admitting: Family Medicine

## 2022-10-01 DIAGNOSIS — E119 Type 2 diabetes mellitus without complications: Secondary | ICD-10-CM

## 2022-10-01 DIAGNOSIS — I10 Essential (primary) hypertension: Secondary | ICD-10-CM

## 2022-10-01 DIAGNOSIS — E1169 Type 2 diabetes mellitus with other specified complication: Secondary | ICD-10-CM

## 2022-10-01 DIAGNOSIS — E1121 Type 2 diabetes mellitus with diabetic nephropathy: Secondary | ICD-10-CM

## 2022-10-01 DIAGNOSIS — Z8673 Personal history of transient ischemic attack (TIA), and cerebral infarction without residual deficits: Secondary | ICD-10-CM

## 2022-10-07 MED ORDER — METFORMIN HCL 500 MG PO TABS
500.0000 mg | ORAL_TABLET | Freq: Two times a day (BID) | ORAL | 0 refills | Status: DC
Start: 2022-10-07 — End: 2022-10-08

## 2022-10-07 NOTE — Addendum Note (Signed)
Addended by: Kavin Leech E on: 10/07/2022 03:05 PM   Modules accepted: Orders

## 2022-10-07 NOTE — Addendum Note (Signed)
Addended by: Smitty Cords on: 10/07/2022 07:53 PM   Modules accepted: Orders

## 2022-10-08 ENCOUNTER — Other Ambulatory Visit: Payer: Self-pay | Admitting: Family Medicine

## 2022-10-08 DIAGNOSIS — E1121 Type 2 diabetes mellitus with diabetic nephropathy: Secondary | ICD-10-CM

## 2022-10-08 DIAGNOSIS — I10 Essential (primary) hypertension: Secondary | ICD-10-CM

## 2022-10-08 DIAGNOSIS — E1169 Type 2 diabetes mellitus with other specified complication: Secondary | ICD-10-CM

## 2022-10-08 DIAGNOSIS — Z8673 Personal history of transient ischemic attack (TIA), and cerebral infarction without residual deficits: Secondary | ICD-10-CM

## 2022-10-08 DIAGNOSIS — E119 Type 2 diabetes mellitus without complications: Secondary | ICD-10-CM

## 2022-10-08 MED ORDER — CLOPIDOGREL BISULFATE 75 MG PO TABS: 75.0000 mg | ORAL_TABLET | Freq: Every day | ORAL | 0 refills | Status: AC

## 2022-10-08 MED ORDER — CARVEDILOL 3.125 MG PO TABS: 3.1250 mg | ORAL_TABLET | Freq: Two times a day (BID) | ORAL | 0 refills | Status: AC

## 2022-10-08 MED ORDER — LISINOPRIL 2.5 MG PO TABS: 2.5000 mg | ORAL_TABLET | Freq: Every day | ORAL | 0 refills | Status: AC

## 2022-10-08 MED ORDER — ROSUVASTATIN CALCIUM 20 MG PO TABS
20.0000 mg | ORAL_TABLET | Freq: Every day | ORAL | 0 refills | Status: AC
Start: 2022-10-08 — End: ?

## 2022-10-08 MED ORDER — METFORMIN HCL 500 MG PO TABS: 500.0000 mg | ORAL_TABLET | Freq: Two times a day (BID) | ORAL | 0 refills | Status: AC

## 2022-10-08 NOTE — Telephone Encounter (Unsigned)
Copied from CRM [id number]. Topic: General - Other >> Oct 08, 2022  1:46 PM Everette C wrote: Reason for CRM: Medication Refill - Medication: rosuvastatin (CRESTOR) 20 MG tablet [[id number]]  lisinopril (ZESTRIL) 2.5 MG tablet [[id number]]  carvedilol (COREG) 3.125 MG tablet [[id number]]  clopidogrel (PLAVIX) 75 MG tablet [[id number]]    metFORMIN (GLUCOPHAGE) 500 MG tablet [[id number]]   Has the patient contacted their pharmacy? Yes.   (Agent: If no, request that the patient contact the pharmacy for the refill. If patient does not wish to contact the pharmacy document the reason why and proceed with request.) (Agent: If yes, when and what did the pharmacy advise?)  Preferred Pharmacy (with phone number or street name): [id number] [place] [place] - [address] [address] [address] [place] [address] Phone: [phone] Fax: [phone] Hours: Not open 24 hours NPI [phone]   Has the patient been seen for an appointment in the last year OR does the patient have an upcoming appointment? Yes.    Agent: Please be advised that RX refills may take up to 3 business days. We ask that you follow-up with your pharmacy.

## 2022-10-08 NOTE — Addendum Note (Signed)
Addended by: Smitty Cords on: 10/08/2022 04:39 PM   Modules accepted: Orders

## 2022-10-20 DIAGNOSIS — Z79899 Other long term (current) drug therapy: Secondary | ICD-10-CM | POA: Diagnosis not present

## 2022-10-20 DIAGNOSIS — N183 Chronic kidney disease, stage 3 unspecified: Secondary | ICD-10-CM | POA: Diagnosis not present

## 2022-10-20 DIAGNOSIS — I129 Hypertensive chronic kidney disease with stage 1 through stage 4 chronic kidney disease, or unspecified chronic kidney disease: Secondary | ICD-10-CM | POA: Diagnosis not present

## 2022-10-20 DIAGNOSIS — E1169 Type 2 diabetes mellitus with other specified complication: Secondary | ICD-10-CM | POA: Diagnosis not present

## 2022-10-20 DIAGNOSIS — E785 Hyperlipidemia, unspecified: Secondary | ICD-10-CM | POA: Diagnosis not present

## 2022-10-20 DIAGNOSIS — Z8673 Personal history of transient ischemic attack (TIA), and cerebral infarction without residual deficits: Secondary | ICD-10-CM | POA: Diagnosis not present

## 2022-10-20 DIAGNOSIS — E1122 Type 2 diabetes mellitus with diabetic chronic kidney disease: Secondary | ICD-10-CM | POA: Diagnosis not present

## 2022-10-20 DIAGNOSIS — Z7901 Long term (current) use of anticoagulants: Secondary | ICD-10-CM | POA: Diagnosis not present

## 2022-10-20 DIAGNOSIS — E041 Nontoxic single thyroid nodule: Secondary | ICD-10-CM | POA: Diagnosis not present

## 2022-10-20 DIAGNOSIS — Z7984 Long term (current) use of oral hypoglycemic drugs: Secondary | ICD-10-CM | POA: Diagnosis not present

## 2023-04-11 ENCOUNTER — Telehealth: Payer: Self-pay | Admitting: Family Medicine

## 2023-04-11 DIAGNOSIS — Z8673 Personal history of transient ischemic attack (TIA), and cerebral infarction without residual deficits: Secondary | ICD-10-CM

## 2023-04-11 DIAGNOSIS — E1121 Type 2 diabetes mellitus with diabetic nephropathy: Secondary | ICD-10-CM

## 2023-04-11 DIAGNOSIS — E785 Hyperlipidemia, unspecified: Secondary | ICD-10-CM

## 2023-04-11 DIAGNOSIS — E119 Type 2 diabetes mellitus without complications: Secondary | ICD-10-CM

## 2023-04-11 DIAGNOSIS — E1169 Type 2 diabetes mellitus with other specified complication: Secondary | ICD-10-CM

## 2023-04-11 DIAGNOSIS — I10 Essential (primary) hypertension: Secondary | ICD-10-CM

## 2023-04-13 NOTE — Telephone Encounter (Signed)
 Requested medications are due for refill today.  yes  Requested medications are on the active medications list.  yes  Last refill. 10/08/2022 - 6 month supply for each  Future visit scheduled.   no  Notes to clinic.  Labs are expired for each med refill.    Requested Prescriptions  Pending Prescriptions Disp Refills   carvedilol (COREG) 3.125 MG tablet [Pharmacy Med Name: CARVEDILOL TAB 3.125MG ] 360 tablet 0    Sig: TAKE ONE TABLET TWICE DAILY     Cardiovascular: Beta Blockers 3 Failed - 04/13/2023  3:57 PM      Failed - Cr in normal range and within 360 days    Creat  Date Value Ref Range Status  01/31/2022 1.46 (H) 0.70 - 1.35 mg/dL Final   Creatinine, Urine  Date Value Ref Range Status  01/31/2022 118 20 - 320 mg/dL Final         Failed - AST in normal range and within 360 days    AST  Date Value Ref Range Status  01/31/2022 16 10 - 35 U/L Final   SGOT(AST)  Date Value Ref Range Status  10/19/2011 20 15 - 37 Unit/L Final         Failed - ALT in normal range and within 360 days    ALT  Date Value Ref Range Status  01/31/2022 16 9 - 46 U/L Final   SGPT (ALT)  Date Value Ref Range Status  10/19/2011 23 12 - 78 U/L Final         Failed - Valid encounter within last 6 months    Recent Outpatient Visits   None            Passed - Last BP in normal range    BP Readings from Last 1 Encounters:  01/30/22 120/84         Passed - Last Heart Rate in normal range    Pulse Readings from Last 1 Encounters:  01/30/22 83          metFORMIN (GLUCOPHAGE) 500 MG tablet [Pharmacy Med Name: METFORMIN TAB 500MG ] 360 tablet 1    Sig: TAKE 1 TABLET TWICE A DAY     Endocrinology:  Diabetes - Biguanides Failed - 04/13/2023  3:57 PM      Failed - Cr in normal range and within 360 days    Creat  Date Value Ref Range Status  01/31/2022 1.46 (H) 0.70 - 1.35 mg/dL Final   Creatinine, Urine  Date Value Ref Range Status  01/31/2022 118 20 - 320 mg/dL Final          Failed - HBA1C is between 0 and 7.9 and within 180 days    Hemoglobin A1C  Date Value Ref Range Status  10/03/2011 6.8 (H) 4.2 - 6.3 % Final    Comment:    The American Diabetes Association recommends that a primary goal of therapy should be <7% and that physicians should reevaluate the treatment regimen in patients with HbA1c values consistently >8%.    Hgb A1c MFr Bld  Date Value Ref Range Status  01/31/2022 7.5 (H) <5.7 % of total Hgb Final    Comment:    For someone without known diabetes, a hemoglobin A1c value of 6.5% or greater indicates that they may have  diabetes and this should be confirmed with a follow-up  test. . For someone with known diabetes, a value <7% indicates  that their diabetes is well controlled and a value  greater than  or equal to 7% indicates suboptimal  control. A1c targets should be individualized based on  duration of diabetes, age, comorbid conditions, and  other considerations. . Currently, no consensus exists regarding use of hemoglobin A1c for diagnosis of diabetes for children. .          Failed - eGFR in normal range and within 360 days    GFR, Est African American  Date Value Ref Range Status  12/23/2019 53 (L) > OR = 60 mL/min/1.22m2 Final   GFR, Est Non African American  Date Value Ref Range Status  12/23/2019 45 (L) > OR = 60 mL/min/1.55m2 Final   eGFR  Date Value Ref Range Status  01/31/2022 54 (L) > OR = 60 mL/min/1.54m2 Final         Failed - B12 Level in normal range and within 720 days    No results found for: "VITAMINB12"       Failed - Valid encounter within last 6 months    Recent Outpatient Visits   None            Failed - CBC within normal limits and completed in the last 12 months    WBC  Date Value Ref Range Status  01/31/2022 6.7 3.8 - 10.8 Thousand/uL Final   RBC  Date Value Ref Range Status  01/31/2022 4.25 4.20 - 5.80 Million/uL Final   Hemoglobin  Date Value Ref Range Status  01/31/2022 12.6  (L) 13.2 - 17.1 g/dL Final   HGB  Date Value Ref Range Status  10/19/2011 13.4 13.0 - 18.0 g/dL Final   HCT  Date Value Ref Range Status  01/31/2022 39.4 38.5 - 50.0 % Final  10/19/2011 41.1 40.0 - 52.0 % Final   MCHC  Date Value Ref Range Status  01/31/2022 32.0 32.0 - 36.0 g/dL Final   [facility]  Date Value Ref Range Status  01/31/2022 29.6 27.0 - 33.0 pg Final   MCV  Date Value Ref Range Status  01/31/2022 92.7 80.0 - 100.0 fL Final  10/19/2011 90 80 - 100 fL Final   No results found for: "PLTCOUNTKUC", "LABPLAT", "POCPLA" RDW  Date Value Ref Range Status  01/31/2022 12.9 11.0 - 15.0 % Final  10/19/2011 12.8 11.5 - 14.5 % Final          clopidogrel (PLAVIX) 75 MG tablet [Pharmacy Med Name: CLOPIDOGREL  TAB 75MG ] 180 tablet 0    Sig: TAKE ONE TABLET DAILY     Hematology: Antiplatelets - clopidogrel Failed - 04/13/2023  3:57 PM      Failed - HCT in normal range and within 180 days    HCT  Date Value Ref Range Status  01/31/2022 39.4 38.5 - 50.0 % Final  10/19/2011 41.1 40.0 - 52.0 % Final         Failed - HGB in normal range and within 180 days    Hemoglobin  Date Value Ref Range Status  01/31/2022 12.6 (L) 13.2 - 17.1 g/dL Final   HGB  Date Value Ref Range Status  10/19/2011 13.4 13.0 - 18.0 g/dL Final         Failed - PLT in normal range and within 180 days    Platelets  Date Value Ref Range Status  01/31/2022 185 140 - 400 Thousand/uL Final   Platelet  Date Value Ref Range Status  10/19/2011 251 150 - 440 x10 3/mm 3 Final         Failed - Cr in normal range and within  360 days    Creat  Date Value Ref Range Status  01/31/2022 1.46 (H) 0.70 - 1.35 mg/dL Final   Creatinine, Urine  Date Value Ref Range Status  01/31/2022 118 20 - 320 mg/dL Final         Failed - Valid encounter within last 6 months    Recent Outpatient Visits   None             rosuvastatin (CRESTOR) 20 MG tablet [Pharmacy Med Name: ROSUVASTATIN TAB 20MG ] 180 tablet 0     Sig: TAKE 1 TABLET DAILY     Cardiovascular:  Antilipid - Statins 2 Failed - 04/13/2023  3:57 PM      Failed - Cr in normal range and within 360 days    Creat  Date Value Ref Range Status  01/31/2022 1.46 (H) 0.70 - 1.35 mg/dL Final   Creatinine, Urine  Date Value Ref Range Status  01/31/2022 118 20 - 320 mg/dL Final         Failed - Valid encounter within last 12 months    Recent Outpatient Visits   None            Failed - Lipid Panel in normal range within the last 12 months    Cholesterol  Date Value Ref Range Status  01/31/2022 99 <200 mg/dL Final  36/64/4034 742 0 - 200 mg/dL Final   Ldl Cholesterol, Calc  Date Value Ref Range Status  10/04/2011 41 0 - 100 mg/dL Final   LDL Cholesterol (Calc)  Date Value Ref Range Status  01/31/2022 26 mg/dL (calc) Final    Comment:    Reference range: <100 . Desirable range <100 mg/dL for primary prevention;   <70 mg/dL for patients with CHD or diabetic patients  with > or = 2 CHD risk factors. Marland Kitchen LDL-C is now calculated using the Martin-Hopkins  calculation, which is a validated novel method providing  better accuracy than the Friedewald equation in the  estimation of LDL-C.  Horald Pollen et al. Lenox Ahr. 5956;387(56): 2061-2068  (http://education.[facility].com/faq/FAQ164)    HDL Cholesterol  Date Value Ref Range Status  10/04/2011 43 40 - 60 mg/dL Final   HDL  Date Value Ref Range Status  01/31/2022 53 > OR = 40 mg/dL Final   Triglycerides  Date Value Ref Range Status  01/31/2022 113 <150 mg/dL Final  43/32/9518 841 0 - 200 mg/dL Final         Passed - Patient is not pregnant       lisinopril (ZESTRIL) 2.5 MG tablet [Pharmacy Med Name: LISINOPRIL TAB 2.5MG ] 180 tablet 0    Sig: TAKE ONE TABLET DAILY     Cardiovascular:  ACE Inhibitors Failed - 04/13/2023  3:57 PM      Failed - Cr in normal range and within 180 days    Creat  Date Value Ref Range Status  01/31/2022 1.46 (H) 0.70 - 1.35 mg/dL Final    Creatinine, Urine  Date Value Ref Range Status  01/31/2022 118 20 - 320 mg/dL Final         Failed - K in normal range and within 180 days    Potassium  Date Value Ref Range Status  01/31/2022 4.0 3.5 - 5.3 mmol/L Final  10/20/2011 3.9 3.5 - 5.1 mmol/L Final         Failed - Valid encounter within last 6 months    Recent Outpatient Visits   None  Passed - Patient is not pregnant      Passed - Last BP in normal range    BP Readings from Last 1 Encounters:  01/30/22 120/84

## 2023-04-13 NOTE — Telephone Encounter (Signed)
 Patient has switched PCP to [facility] Dr Newman Nickels.  I have cancelled refills that were previously under our name.  Saralyn Pilar, DO [facility] 04/13/2023, 4:30 PM
